# Patient Record
Sex: Female | Born: 1965 | Race: Black or African American | Hispanic: No | Marital: Single | Smoking: Current every day smoker
Health system: Southern US, Community
[De-identification: ages and names within clinical notes are randomized; demographics above are authoritative.]

## PROBLEM LIST (undated history)

## (undated) DIAGNOSIS — E785 Hyperlipidemia, unspecified: Secondary | ICD-10-CM

## (undated) DIAGNOSIS — I10 Essential (primary) hypertension: Secondary | ICD-10-CM

## (undated) DIAGNOSIS — E119 Type 2 diabetes mellitus without complications: Secondary | ICD-10-CM

## (undated) HISTORY — PX: ABDOMINAL HYSTERECTOMY: SHX81

## (undated) HISTORY — DX: Type 2 diabetes mellitus without complications: E11.9

## (undated) HISTORY — DX: Hyperlipidemia, unspecified: E78.5

## (undated) HISTORY — PX: BREAST EXCISIONAL BIOPSY: SUR124

## (undated) HISTORY — DX: Essential (primary) hypertension: I10

## (undated) HISTORY — PX: OTHER SURGICAL HISTORY: SHX169

---

## 1997-10-20 ENCOUNTER — Other Ambulatory Visit: Admission: RE | Admit: 1997-10-20 | Discharge: 1997-10-20 | Payer: Self-pay | Admitting: *Deleted

## 1997-11-25 ENCOUNTER — Encounter: Admission: RE | Admit: 1997-11-25 | Discharge: 1997-11-25 | Payer: Self-pay | Admitting: Obstetrics & Gynecology

## 1997-12-01 ENCOUNTER — Ambulatory Visit (HOSPITAL_COMMUNITY): Admission: RE | Admit: 1997-12-01 | Discharge: 1997-12-01 | Payer: Self-pay | Admitting: Internal Medicine

## 1997-12-23 ENCOUNTER — Encounter: Admission: RE | Admit: 1997-12-23 | Discharge: 1997-12-23 | Payer: Self-pay | Admitting: Obstetrics & Gynecology

## 1998-01-08 ENCOUNTER — Inpatient Hospital Stay (HOSPITAL_COMMUNITY): Admission: AD | Admit: 1998-01-08 | Discharge: 1998-01-08 | Payer: Self-pay | Admitting: Obstetrics

## 1998-05-04 ENCOUNTER — Inpatient Hospital Stay (HOSPITAL_COMMUNITY): Admission: RE | Admit: 1998-05-04 | Discharge: 1998-05-06 | Payer: Self-pay | Admitting: Obstetrics and Gynecology

## 1998-12-11 ENCOUNTER — Emergency Department (HOSPITAL_COMMUNITY): Admission: EM | Admit: 1998-12-11 | Discharge: 1998-12-11 | Payer: Self-pay | Admitting: Emergency Medicine

## 1998-12-11 ENCOUNTER — Encounter: Payer: Self-pay | Admitting: Emergency Medicine

## 1999-01-05 ENCOUNTER — Inpatient Hospital Stay (HOSPITAL_COMMUNITY): Admission: AD | Admit: 1999-01-05 | Discharge: 1999-01-05 | Payer: Self-pay | Admitting: *Deleted

## 2000-11-02 ENCOUNTER — Emergency Department (HOSPITAL_COMMUNITY): Admission: EM | Admit: 2000-11-02 | Discharge: 2000-11-02 | Payer: Self-pay | Admitting: Emergency Medicine

## 2000-12-25 ENCOUNTER — Other Ambulatory Visit: Admission: RE | Admit: 2000-12-25 | Discharge: 2000-12-25 | Payer: Self-pay | Admitting: Internal Medicine

## 2002-08-01 ENCOUNTER — Inpatient Hospital Stay (HOSPITAL_COMMUNITY): Admission: AD | Admit: 2002-08-01 | Discharge: 2002-08-01 | Payer: Self-pay | Admitting: Family Medicine

## 2003-01-17 ENCOUNTER — Ambulatory Visit (HOSPITAL_COMMUNITY): Admission: RE | Admit: 2003-01-17 | Discharge: 2003-01-17 | Payer: Self-pay | Admitting: Obstetrics

## 2003-01-17 ENCOUNTER — Encounter: Payer: Self-pay | Admitting: Obstetrics

## 2003-03-13 ENCOUNTER — Emergency Department (HOSPITAL_COMMUNITY): Admission: AD | Admit: 2003-03-13 | Discharge: 2003-03-13 | Payer: Self-pay | Admitting: Family Medicine

## 2003-03-20 ENCOUNTER — Inpatient Hospital Stay (HOSPITAL_COMMUNITY): Admission: RE | Admit: 2003-03-20 | Discharge: 2003-03-22 | Payer: Self-pay | Admitting: Obstetrics

## 2003-06-19 ENCOUNTER — Emergency Department (HOSPITAL_COMMUNITY): Admission: AD | Admit: 2003-06-19 | Discharge: 2003-06-19 | Payer: Self-pay | Admitting: Family Medicine

## 2003-06-28 ENCOUNTER — Emergency Department (HOSPITAL_COMMUNITY): Admission: AD | Admit: 2003-06-28 | Discharge: 2003-06-28 | Payer: Self-pay | Admitting: Family Medicine

## 2004-02-04 ENCOUNTER — Emergency Department (HOSPITAL_COMMUNITY): Admission: EM | Admit: 2004-02-04 | Discharge: 2004-02-04 | Payer: Self-pay | Admitting: Family Medicine

## 2004-05-28 ENCOUNTER — Ambulatory Visit (HOSPITAL_COMMUNITY): Admission: RE | Admit: 2004-05-28 | Discharge: 2004-05-28 | Payer: Self-pay | Admitting: Obstetrics

## 2004-10-15 ENCOUNTER — Emergency Department (HOSPITAL_COMMUNITY): Admission: EM | Admit: 2004-10-15 | Discharge: 2004-10-15 | Payer: Self-pay | Admitting: Family Medicine

## 2006-07-31 ENCOUNTER — Emergency Department (HOSPITAL_COMMUNITY): Admission: EM | Admit: 2006-07-31 | Discharge: 2006-07-31 | Payer: Self-pay | Admitting: Family Medicine

## 2010-10-24 ENCOUNTER — Inpatient Hospital Stay (INDEPENDENT_AMBULATORY_CARE_PROVIDER_SITE_OTHER)
Admission: RE | Admit: 2010-10-24 | Discharge: 2010-10-24 | Disposition: A | Discharge status: Home / Self Care | Payer: Self-pay | Source: Ambulatory Visit | Attending: Family Medicine | Admitting: Family Medicine

## 2010-10-24 DIAGNOSIS — L42 Pityriasis rosea: Secondary | ICD-10-CM

## 2010-12-07 ENCOUNTER — Inpatient Hospital Stay (INDEPENDENT_AMBULATORY_CARE_PROVIDER_SITE_OTHER)
Admission: RE | Admit: 2010-12-07 | Discharge: 2010-12-07 | Disposition: A | Discharge status: Home / Self Care | Payer: Self-pay | Source: Ambulatory Visit | Attending: Family Medicine | Admitting: Family Medicine

## 2010-12-07 DIAGNOSIS — S335XXA Sprain of ligaments of lumbar spine, initial encounter: Secondary | ICD-10-CM

## 2010-12-07 DIAGNOSIS — S239XXA Sprain of unspecified parts of thorax, initial encounter: Secondary | ICD-10-CM

## 2012-01-15 ENCOUNTER — Encounter (HOSPITAL_COMMUNITY): Payer: Self-pay | Admitting: Emergency Medicine

## 2012-01-15 ENCOUNTER — Emergency Department (INDEPENDENT_AMBULATORY_CARE_PROVIDER_SITE_OTHER)
Admission: EM | Admit: 2012-01-15 | Discharge: 2012-01-15 | Disposition: A | Discharge status: Home / Self Care | Payer: PRIVATE HEALTH INSURANCE | Source: Home / Self Care

## 2012-01-15 DIAGNOSIS — K529 Noninfective gastroenteritis and colitis, unspecified: Secondary | ICD-10-CM

## 2012-01-15 DIAGNOSIS — R1084 Generalized abdominal pain: Secondary | ICD-10-CM

## 2012-01-15 DIAGNOSIS — K5289 Other specified noninfective gastroenteritis and colitis: Secondary | ICD-10-CM

## 2012-01-15 LAB — POCT URINALYSIS DIP (DEVICE)
Bilirubin Urine: NEGATIVE
Glucose, UA: NEGATIVE mg/dL
Ketones, ur: NEGATIVE mg/dL
Leukocytes, UA: NEGATIVE
Nitrite: NEGATIVE
Protein, ur: NEGATIVE mg/dL
Specific Gravity, Urine: 1.015 (ref 1.005–1.030)
Urobilinogen, UA: 0.2 mg/dL (ref 0.0–1.0)
pH: 5.5 (ref 5.0–8.0)

## 2012-01-15 MED ORDER — HYOSCYAMINE SULFATE 0.125 MG SL SUBL: 0.1250 mg | SUBLINGUAL_TABLET | SUBLINGUAL | Status: DC | PRN

## 2012-01-15 MED ORDER — ONDANSETRON HCL 4 MG PO TABS: 4.0000 mg | ORAL_TABLET | Freq: Four times a day (QID) | ORAL | Status: DC

## 2012-01-15 NOTE — ED Provider Notes (Signed)
History     CSN: [id number]  Arrival date & time 01/15/12  1520   None     Chief Complaint  Patient presents with  . Abdominal Pain    (Consider location/radiation/quality/duration/timing/severity/associated sxs/prior treatment) HPI Comments: Refill female presents with abdominal cramping. 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 This week this patient developed the same symptoms which lasted for about 3 days. Today she has no or vomiting or diarrhea but continues to have nausea and severe abdominal cramping. She is able to hold food and liquids down. Just complains of urinary frequency for 2 days but no dysuria   History reviewed. No pertinent past medical history.  Past Surgical History  Procedure Date  . Partial historectomy     History reviewed. No pertinent family history.  History  Substance Use Topics  . Smoking status: Current Every Day Smoker -- 1.0 packs/day    Types: Cigarettes  . Smokeless tobacco: Not on file  . Alcohol Use: Yes    OB History    Grav Para Term Preterm Abortions TAB SAB Ect Mult Living                  Review of Systems  Constitutional: Negative for fever, activity change and fatigue.  HENT: Negative.   Respiratory: Negative for cough, shortness of breath and wheezing.   Cardiovascular: Negative for chest pain and palpitations.  Gastrointestinal: Positive for nausea and abdominal pain. Negative for vomiting, diarrhea, constipation and blood in stool.  Genitourinary: Positive for frequency. Negative for dysuria.  Musculoskeletal: Negative.   Skin: Negative for color change, pallor and rash.  Neurological: Negative.     Allergies  Review of patient's allergies indicates no known allergies.  Home Medications   Current Outpatient Rx  Name Route Sig Dispense Refill  . HYOSCYAMINE SULFATE 0.125 MG SL SUBL  Sublingual Place 1 tablet (0.125 mg total) under the tongue every 4 (four) hours as needed for cramping. 30 tablet 0  . ONDANSETRON HCL 4 MG PO TABS Oral Take 1 tablet (4 mg total) by mouth every 6 (six) hours. 12 tablet 0    BP 144/84  Pulse 87  Temp 99.1 F (37.3 C) (Oral)  Resp 14  SpO2 100%  Physical Exam  Constitutional: She is oriented to person, place, and time. She appears well-developed and well-nourished. No distress.  HENT:  Head: Normocephalic and atraumatic.  Mouth/Throat: Oropharynx is clear and moist. No oropharyngeal exudate.  Eyes: EOM are normal. Pupils are equal, round, and reactive to light.  Neck: Normal range of motion. Neck supple.  Cardiovascular: Normal rate and normal heart sounds.   Pulmonary/Chest: Effort normal and breath sounds normal. No respiratory distress.  Abdominal: Soft. She exhibits no mass. There is tenderness. There is no rebound and no guarding.       Abdominal tenderness in the epigastrium  Musculoskeletal: Normal range of motion.  Neurological: She is alert and oriented to person, place, and time. No cranial nerve deficit.  Skin: Skin is warm and dry.  Psychiatric: She has a normal mood and affect.    ED Course  Procedures (including critical care time)  Labs Reviewed - No data to display No results found.   1. Gastroenteritis   2. Generalized abdominal cramps       MDM  Clear liquids and Gatorade in small amounts. Frequently Less than one by mouth  every 4-6 hours when necessary abdominal cramping Zofran 4 mg 1 by mouth every 4-6 hours when necessary nausea They also take Zantac 75 twice a day when necessary BRAT diet Results for orders placed during the hospital encounter of 01/15/12  POCT URINALYSIS DIP (DEVICE)      Component Value Range   Glucose, UA NEGATIVE  NEGATIVE mg/dL   Bilirubin Urine NEGATIVE  NEGATIVE   Ketones, ur NEGATIVE  NEGATIVE mg/dL   Specific Gravity, Urine 1.015  1.005 - 1.030   Hgb urine dipstick  TRACE (*) NEGATIVE   pH 5.5  5.0 - 8.0   Protein, ur NEGATIVE  NEGATIVE mg/dL   Urobilinogen, UA 0.2  0.0 - 1.0 mg/dL   Nitrite NEGATIVE  NEGATIVE   Leukocytes, UA NEGATIVE  NEGATIVE           Hayden Rasmussen, NP 01/15/12 1744  Hayden Rasmussen, NP 01/15/12 1747

## 2012-01-15 NOTE — ED Notes (Signed)
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 At the beginning of this past week pt had diarrhea and vomiting x 2/3 days.  No longer has vomiting and diarrhea since Wednesday but still having severe stomach cramps. Denies any other symptoms.

## 2012-01-15 NOTE — ED Provider Notes (Signed)
Medical screening examination/treatment/procedure(s) were performed by resident physician or non-physician practitioner and as supervising physician I was immediately available for consultation/collaboration.   Barkley Bruns MD.    Linna Hoff, MD 01/15/12 [phone]

## 2013-03-03 ENCOUNTER — Encounter (HOSPITAL_COMMUNITY): Payer: Self-pay | Admitting: Emergency Medicine

## 2013-03-03 ENCOUNTER — Emergency Department (HOSPITAL_COMMUNITY)
Admission: EM | Admit: 2013-03-03 | Discharge: 2013-03-03 | Disposition: A | Discharge status: Home / Self Care | Payer: Self-pay | Source: Home / Self Care

## 2013-03-03 DIAGNOSIS — H109 Unspecified conjunctivitis: Secondary | ICD-10-CM

## 2013-03-03 MED ORDER — KETOTIFEN FUMARATE 0.025 % OP SOLN: 1.0000 [drp] | Freq: Two times a day (BID) | OPHTHALMIC | Status: DC

## 2013-03-03 MED ORDER — POLYMYXIN B-TRIMETHOPRIM 10000-0.1 UNIT/ML-% OP SOLN: 1.0000 [drp] | OPHTHALMIC | Status: DC

## 2013-03-03 NOTE — ED Provider Notes (Signed)
CSN: [id number]     Arrival date & time 03/03/13  0920 History   First MD Initiated Contact with Patient 03/03/13 [phone]     Chief Complaint  Patient presents with  . Conjunctivitis   (Consider location/radiation/quality/duration/timing/severity/associated sxs/prior Treatment) HPI Comments: C/O OD itching, upper and lower lid swelling, tearing and general discomfort of R eye since yesterday; worsening over the night. No problems with vision   History reviewed. No pertinent past medical history. Past Surgical History  Procedure Laterality Date  . Partial historectomy     No family history on file. History  Substance Use Topics  . Smoking status: Current Every Day Smoker -- 1.00 packs/day    Types: Cigarettes  . Smokeless tobacco: Not on file  . Alcohol Use: Yes   OB History   Grav Para Term Preterm Abortions TAB SAB Ect Mult Living                 Review of Systems  Constitutional: Negative.   HENT: Negative.   Eyes: Positive for discharge, redness and itching. Negative for photophobia and visual disturbance.  Respiratory: Negative.   All other systems reviewed and are negative.    Allergies  Review of patient's allergies indicates no known allergies.  Home Medications   Current Outpatient Rx  Name  Route  Sig  Dispense  Refill  . lisinopril-hydrochlorothiazide (PRINZIDE,ZESTORETIC) 20-12.5 MG per tablet   Oral   Take 1 tablet by mouth daily.         . rosuvastatin (CRESTOR) 20 MG tablet   Oral   Take 20 mg by mouth daily.         . Vitamin D, Ergocalciferol, (DRISDOL) 50000 UNITS CAPS capsule   Oral   Take 50,000 Units by mouth every 7 (seven) days.         . hyoscyamine (LEVSIN/SL) 0.125 MG SL tablet   Sublingual   Place 1 tablet (0.125 mg total) under the tongue every 4 (four) hours as needed for cramping.   30 tablet   0   . ondansetron (ZOFRAN) 4 MG tablet   Oral   Take 1 tablet (4 mg total) by mouth every 6 (six) hours.   12 tablet   0     BP 148/79  Pulse 83  Temp(Src) 98.4 F (36.9 C) (Oral)  Resp 20  SpO2 100% Physical Exam  Nursing note and vitals reviewed. Constitutional: She is oriented to person, place, and time. She appears well-developed and well-nourished. No distress.  Eyes:  Mild upper and lower lid edema, conjunctival erythema , mucoid clear discharge.  The OS with early signs of conjunctivitis but asymptomatic.  + for bilateral xanthelasma and corneal arcus.  Neck: Normal range of motion. Neck supple.  Cardiovascular: Normal rate.   Pulmonary/Chest: Effort normal. No respiratory distress.  Lymphadenopathy:    She has no cervical adenopathy.  Neurological: She is alert and oriented to person, place, and time. She exhibits normal muscle tone.  Skin: Skin is warm and dry.  Psychiatric: She has a normal mood and affect.    ED Course  Procedures (including critical care time) Labs Review Labs Reviewed - No data to display Imaging Review No results found.     MDM   1. Conjunctivitis of right eye      Polytrim OP as directed zaditor bid Warm compresses. Wash hands frequently  Hayden Rasmussen, NP 03/03/13 1009

## 2013-03-03 NOTE — ED Notes (Signed)
47 yr old is here today with complaints of red itching right x last night. She states she woke up with her right eye red but ir started itching last night. No other complaints.

## 2013-03-06 NOTE — ED Provider Notes (Signed)
Medical screening examination/treatment/procedure(s) were performed by resident physician or non-physician practitioner and as supervising physician I was immediately available for consultation/collaboration.   Barkley Bruns MD.   Linna Hoff, MD 03/06/13 Ebony Cargo

## 2013-03-07 ENCOUNTER — Other Ambulatory Visit: Payer: Self-pay

## 2013-03-07 DIAGNOSIS — Z1231 Encounter for screening mammogram for malignant neoplasm of breast: Secondary | ICD-10-CM

## 2013-04-22 ENCOUNTER — Ambulatory Visit: Payer: PRIVATE HEALTH INSURANCE

## 2013-05-07 ENCOUNTER — Emergency Department (INDEPENDENT_AMBULATORY_CARE_PROVIDER_SITE_OTHER): Payer: No Typology Code available for payment source

## 2013-05-07 ENCOUNTER — Encounter (HOSPITAL_COMMUNITY): Payer: Self-pay | Admitting: Emergency Medicine

## 2013-05-07 ENCOUNTER — Emergency Department (INDEPENDENT_AMBULATORY_CARE_PROVIDER_SITE_OTHER)
Admission: EM | Admit: 2013-05-07 | Discharge: 2013-05-07 | Disposition: A | Discharge status: Home / Self Care | Payer: No Typology Code available for payment source | Source: Home / Self Care | Attending: Family Medicine | Admitting: Family Medicine

## 2013-05-07 DIAGNOSIS — W1789XA Other fall from one level to another, initial encounter: Secondary | ICD-10-CM

## 2013-05-07 DIAGNOSIS — S93409A Sprain of unspecified ligament of unspecified ankle, initial encounter: Secondary | ICD-10-CM

## 2013-05-07 DIAGNOSIS — S93402A Sprain of unspecified ligament of left ankle, initial encounter: Secondary | ICD-10-CM

## 2013-05-07 MED ORDER — HYDROCODONE-ACETAMINOPHEN 5-325 MG PO TABS: 1.0000 | ORAL_TABLET | Freq: Four times a day (QID) | ORAL | Status: DC | PRN

## 2013-05-07 NOTE — ED Provider Notes (Signed)
CSN: [id number]     Arrival date & time 05/07/13  1314 History   First MD Initiated Contact with Patient 05/07/13 1433     Chief Complaint  Patient presents with  . Ankle Injury   (Consider location/radiation/quality/duration/timing/severity/associated sxs/prior Treatment) Patient is a 48 y.o. female presenting with lower extremity injury. The history is provided by the patient and the spouse.  Ankle Injury This is a new problem. The current episode started 6 to 12 hours ago (stepped in pothole and fell striking left knee and twisting ankle.). The problem has not changed since onset.The symptoms are aggravated by walking.    History reviewed. No pertinent past medical history. Past Surgical History  Procedure Laterality Date  . Partial historectomy     History reviewed. No pertinent family history. History  Substance Use Topics  . Smoking status: Current Every Day Smoker -- 1.00 packs/day    Types: Cigarettes  . Smokeless tobacco: Not on file  . Alcohol Use: Yes   OB History   Grav Para Term Preterm Abortions TAB SAB Ect Mult Living                 Review of Systems  Constitutional: Negative.   Musculoskeletal: Positive for gait problem and joint swelling.  Skin: Negative.     Allergies  Review of patient's allergies indicates no known allergies.  Home Medications   Current Outpatient Rx  Name  Route  Sig  Dispense  Refill  . HYDROcodone-acetaminophen (NORCO/VICODIN) 5-325 MG per tablet   Oral   Take 1 tablet by mouth every 6 (six) hours as needed. For pain   15 tablet   0   . hyoscyamine (LEVSIN/SL) 0.125 MG SL tablet   Sublingual   Place 1 tablet (0.125 mg total) under the tongue every 4 (four) hours as needed for cramping.   30 tablet   0   . ketotifen (ZADITOR) 0.025 % ophthalmic solution   Both Eyes   Place 1 drop into both eyes 2 (two) times daily.   5 mL   0   . lisinopril-hydrochlorothiazide (PRINZIDE,ZESTORETIC) 20-12.5 MG per tablet   Oral  Take 1 tablet by mouth daily.         . ondansetron (ZOFRAN) 4 MG tablet   Oral   Take 1 tablet (4 mg total) by mouth every 6 (six) hours.   12 tablet   0   . rosuvastatin (CRESTOR) 20 MG tablet   Oral   Take 20 mg by mouth daily.         Marland Kitchen trimethoprim-polymyxin b (POLYTRIM) ophthalmic solution   Both Eyes   Place 1 drop into both eyes every 4 (four) hours.   10 mL   0   . Vitamin D, Ergocalciferol, (DRISDOL) 50000 UNITS CAPS capsule   Oral   Take 50,000 Units by mouth every 7 (seven) days.          BP 190/112  Pulse 92  Temp(Src) 99 F (37.2 C) (Oral)  Resp 18  SpO2 100% Physical Exam  Nursing note and vitals reviewed. Constitutional: She is oriented to person, place, and time. She appears well-developed and well-nourished.  Musculoskeletal: She exhibits tenderness.       Left ankle: She exhibits decreased range of motion and swelling. She exhibits no deformity and normal pulse. Tenderness. Lateral malleolus, AITFL and head of 5th metatarsal tenderness found. No medial malleolus and no proximal fibula tenderness found. Achilles tendon normal.       Legs:  Neurological: She is alert and oriented to person, place, and time.  Skin: Skin is warm and dry.    ED Course  Procedures (including critical care time) Labs Review Labs Reviewed - No data to display Imaging Review Dg Ankle Complete Left  05/07/2013   CLINICAL DATA:  Patient stepped in a hole and fell twisting left ankle pain laterally  EXAM: LEFT ANKLE COMPLETE - 3+ VIEW  COMPARISON:  None.  FINDINGS: Mild lateral soft tissue swelling. Mortise is intact. No fracture or dislocation. Small ossicle noted off the medial malleolus, an anatomic variant. Small heel spur.  IMPRESSION: Mild sprain   Electronically Signed   By: Esperanza Heiraymond  Rubner M.D.   On: 05/07/2013 15:02      MDM  X-rays reviewed and report per radiologist.     Linna HoffJames D Chelsa Stout, MD 05/07/13 [phone]

## 2013-05-07 NOTE — Discharge Instructions (Signed)
Wear ankle support as needed for comfort,  Use crutches or activity as tolerated. advil or pain medicine as needed, ice  and elevate for pain and swelling ,return or see orthopedist if further problems.

## 2013-05-07 NOTE — ED Notes (Signed)
C/o left ankle injury which happened today while going to work States she hit a [employer] Data Systemspothole  States as she fell down she hit a left knee and twisted ankle

## 2013-05-16 ENCOUNTER — Ambulatory Visit
Admission: RE | Admit: 2013-05-16 | Discharge: 2013-05-16 | Disposition: A | Discharge status: Home / Self Care | Payer: No Typology Code available for payment source | Source: Ambulatory Visit

## 2013-05-16 DIAGNOSIS — Z1231 Encounter for screening mammogram for malignant neoplasm of breast: Secondary | ICD-10-CM

## 2013-05-20 ENCOUNTER — Other Ambulatory Visit: Payer: Self-pay | Admitting: Nurse Practitioner

## 2013-05-20 DIAGNOSIS — R928 Other abnormal and inconclusive findings on diagnostic imaging of breast: Secondary | ICD-10-CM

## 2013-06-03 ENCOUNTER — Other Ambulatory Visit: Payer: No Typology Code available for payment source

## 2013-06-19 ENCOUNTER — Ambulatory Visit
Admission: RE | Admit: 2013-06-19 | Discharge: 2013-06-19 | Disposition: A | Discharge status: Home / Self Care | Payer: No Typology Code available for payment source | Source: Ambulatory Visit | Attending: Nurse Practitioner | Admitting: Nurse Practitioner

## 2013-06-19 ENCOUNTER — Other Ambulatory Visit: Payer: Self-pay | Admitting: Nurse Practitioner

## 2013-06-19 DIAGNOSIS — R928 Other abnormal and inconclusive findings on diagnostic imaging of breast: Secondary | ICD-10-CM

## 2013-06-26 ENCOUNTER — Ambulatory Visit
Admission: RE | Admit: 2013-06-26 | Discharge: 2013-06-26 | Disposition: A | Discharge status: Home / Self Care | Payer: No Typology Code available for payment source | Source: Ambulatory Visit | Attending: Nurse Practitioner | Admitting: Nurse Practitioner

## 2013-06-26 ENCOUNTER — Other Ambulatory Visit: Payer: Self-pay | Admitting: Nurse Practitioner

## 2013-06-26 DIAGNOSIS — R928 Other abnormal and inconclusive findings on diagnostic imaging of breast: Secondary | ICD-10-CM

## 2013-10-03 ENCOUNTER — Encounter: Payer: BC Managed Care – PPO | Attending: Internal Medicine

## 2013-10-03 VITALS — Ht 65.0 in | Wt 198.0 lb

## 2013-10-03 DIAGNOSIS — Z713 Dietary counseling and surveillance: Secondary | ICD-10-CM | POA: Diagnosis not present

## 2013-10-03 DIAGNOSIS — E119 Type 2 diabetes mellitus without complications: Secondary | ICD-10-CM | POA: Diagnosis not present

## 2013-10-04 NOTE — Progress Notes (Signed)
Patient was seen on 10/03/13 for the first of a series of three diabetes self-management courses at the [facility].  Current HbA1c: unknown  The following learning objectives were met by the patient during this class:  Describe diabetes  State some common risk factors for diabetes  Defines the role of glucose and insulin  Identifies type of diabetes and pathophysiology  Describe the relationship between diabetes and cardiovascular risk  State the members of the Healthcare Team  States the rationale for glucose monitoring  State when to test glucose  State their individual Target Range  State the importance of logging glucose readings  Describe how to interpret glucose readings  Identifies A1C target  Explain the correlation between A1c and eAG values  State symptoms and treatment of high blood glucose  State symptoms and treatment of low blood glucose  Explain proper technique for glucose testing  Identifies proper sharps disposal  Handouts given during class include:  Living Well with Diabetes book  Carb Counting and Meal Planning book  Meal Plan Card  Carbohydrate guide  Meal planning worksheet  Low Sodium Flavoring Tips  The diabetes portion plate  K5T to eAG Conversion Chart  Diabetes Medications  Diabetes Recommended Care Schedule  Support Group  Diabetes Success Plan  Core Class Satisfaction Survey  Follow-Up Plan:  Attend core 2

## 2013-10-10 DIAGNOSIS — E119 Type 2 diabetes mellitus without complications: Secondary | ICD-10-CM | POA: Diagnosis not present

## 2013-10-10 NOTE — Progress Notes (Signed)

## 2013-10-17 DIAGNOSIS — E119 Type 2 diabetes mellitus without complications: Secondary | ICD-10-CM

## 2013-10-17 NOTE — Progress Notes (Signed)
Patient was seen on 10/17/13 for the third of a series of three diabetes self-management courses at the [facility]. The following learning objectives were met by the patient during this class:    State the amount of activity recommended for healthy living   Describe activities suitable for individual needs   Identify ways to regularly incorporate activity into daily life   Identify barriers to activity and ways to over come these barriers  Identify diabetes medications being personally used and their primary action for lowering glucose and possible side effects   Describe role of stress on blood glucose and develop strategies to address psychosocial issues   Identify diabetes complications and ways to prevent them  Explain how to manage diabetes during illness   Evaluate success in meeting personal goal   Establish 2-3 goals that they will plan to diligently work on until they return for the  52-monthfollow-up visit  Goals:  Follow Diabetes Meal Plan as instructed  Aim for 15-30 mins of physical activity daily as tolerated  Bring food record and glucose log to your follow up visit  Your patient has established the following 4 month goals in their individualized success plan: I will test my glucose  I will manage stress   Your patient has identified these potential barriers to change:  None  Your patient has identified their diabetes self-care support plan as  [facility]  Family Support  Plan:  Attend Core 4 in 4 months

## 2014-02-03 ENCOUNTER — Encounter: Payer: BC Managed Care – PPO | Attending: Internal Medicine

## 2014-02-03 DIAGNOSIS — E119 Type 2 diabetes mellitus without complications: Secondary | ICD-10-CM | POA: Insufficient documentation

## 2014-02-03 DIAGNOSIS — Z713 Dietary counseling and surveillance: Secondary | ICD-10-CM | POA: Diagnosis not present

## 2014-02-03 NOTE — Progress Notes (Signed)
Appt start time: 1730 end time:  1830.  Patient was seen on 02/03/14 for a review of the series of three diabetes self-management courses at the [facility]. The following learning objectives were met by the patient during this class:  . Reviewed blood glucose monitoring and interpretation including the recommended target ranges and Hgb A1c.  . Reviewed on carb counting, importance of regularly scheduled meals/snacks, and meal planning.  . Reviewed the effects of physical activity on glucose levels and long-term glucose control.  Recommended goal of 150 minutes of physical activity/week. . Reviewed patient medications and discussed role of medication on blood glucose and possible side effects. . Discussed strategies to manage stress, psychosocial issues, and other obstacles to diabetes management. . Encouraged moderate weight reduction to improve glucose levels.   . Reviewed short-term complications: hyper- and hypo-glycemia.  Discussed causes, symptoms, and treatment options. . Reviewed prevention, detection, and treatment of long-term complications.  Discussed the role of prolonged elevated glucose levels on body systems.  Goals:  Follow Diabetes Meal Plan as instructed  Eat 3 meals and 2 snacks, every 3-5 hrs  Limit carbohydrate intake to 45 grams carbohydrate/meal Limit carbohydrate intake to 15 grams carbohydrate/snack Add lean protein foods to meals/snacks  Monitor glucose levels as instructed by your doctor  Aim for goal of 15-30 mins of physical activity daily as tolerated  Bring food record and glucose log to your next nutrition visit   

## 2018-11-03 ENCOUNTER — Other Ambulatory Visit: Payer: Self-pay | Admitting: Internal Medicine

## 2018-11-03 DIAGNOSIS — Z20822 Contact with and (suspected) exposure to covid-19: Secondary | ICD-10-CM

## 2018-11-04 LAB — NOVEL CORONAVIRUS, NAA: SARS-CoV-2, NAA: NOT DETECTED

## 2019-02-22 ENCOUNTER — Other Ambulatory Visit: Payer: Self-pay

## 2019-02-22 DIAGNOSIS — Z20822 Contact with and (suspected) exposure to covid-19: Secondary | ICD-10-CM

## 2019-02-25 LAB — NOVEL CORONAVIRUS, NAA: SARS-CoV-2, NAA: NOT DETECTED

## 2019-05-29 ENCOUNTER — Telehealth: Payer: Self-pay

## 2019-05-29 NOTE — Telephone Encounter (Signed)
PT LVM WANTING TO ESTABLISH CARE, RTN CALL TO PT MOTHER ANSWERED PHONE STATED THAT SHE WILL ADV [facility] TO CALL BACK

## 2019-06-01 ENCOUNTER — Ambulatory Visit: Payer: 59 | Attending: Internal Medicine

## 2019-06-01 DIAGNOSIS — Z23 Encounter for immunization: Secondary | ICD-10-CM | POA: Insufficient documentation

## 2019-06-01 NOTE — Progress Notes (Signed)
   [facility]ov[facility] Vaccination [facility]  Name:  Madeline Blankenship    MRN: [id number] DOB: May 07, 1965  06/01/2019  [name] was observed post Covid-19 immunization for 15 minutes without incidence. She was provided with Vaccine Information Sheet and instruction to access the V-Safe system.   [name] was instructed to call 911 with any severe reactions post vaccine: Marland Kitchen Difficulty breathing  . Swelling of your face and throat  . A fast heartbeat  . A bad rash all over your body  . Dizziness and weakness    Immunizations Administered    Name Date Dose VIS Date Route   Pfizer COVID-19 Vaccine 06/01/2019 10:00 AM 0.3 mL 03/15/2019 Intramuscular   Manufacturer: [employer], [employer]   Lot: [id number]   NDC: [address]-2

## 2019-06-19 ENCOUNTER — Encounter: Payer: Self-pay | Admitting: Nurse Practitioner

## 2019-06-19 ENCOUNTER — Ambulatory Visit: Payer: 59 | Admitting: Nurse Practitioner

## 2019-06-19 ENCOUNTER — Other Ambulatory Visit: Payer: Self-pay

## 2019-06-19 VITALS — BP 144/86 | HR 99 | Temp 98.7°F | Ht 65.17 in | Wt 194.4 lb

## 2019-06-19 DIAGNOSIS — E782 Mixed hyperlipidemia: Secondary | ICD-10-CM

## 2019-06-19 DIAGNOSIS — E119 Type 2 diabetes mellitus without complications: Secondary | ICD-10-CM | POA: Diagnosis not present

## 2019-06-19 DIAGNOSIS — M25562 Pain in left knee: Secondary | ICD-10-CM

## 2019-06-19 DIAGNOSIS — I10 Essential (primary) hypertension: Secondary | ICD-10-CM | POA: Diagnosis not present

## 2019-06-19 DIAGNOSIS — E559 Vitamin D deficiency, unspecified: Secondary | ICD-10-CM | POA: Diagnosis not present

## 2019-06-19 DIAGNOSIS — M25541 Pain in joints of right hand: Secondary | ICD-10-CM

## 2019-06-19 DIAGNOSIS — G8929 Other chronic pain: Secondary | ICD-10-CM

## 2019-06-19 DIAGNOSIS — Z1211 Encounter for screening for malignant neoplasm of colon: Secondary | ICD-10-CM

## 2019-06-19 DIAGNOSIS — M25542 Pain in joints of left hand: Secondary | ICD-10-CM

## 2019-06-19 DIAGNOSIS — Z1231 Encounter for screening mammogram for malignant neoplasm of breast: Secondary | ICD-10-CM

## 2019-06-19 MED ORDER — AMLODIPINE BESYLATE 5 MG PO TABS: 5.0000 mg | ORAL_TABLET | Freq: Every day | ORAL | 2 refills | Status: DC

## 2019-06-19 NOTE — Progress Notes (Signed)
This visit occurred during the SARS-CoV-2 public health emergency.  Safety protocols were in place, including screening questions prior to the visit, additional usage of staff PPE, and extensive cleaning of exam room while observing appropriate contact time as indicated for disinfecting solutions.  Subjective:     Patient ID: Madeline Blankenship , female    DOB: March 02, 1966 , 54 y.o.   MRN: [id number]   Chief Complaint  Patient presents with  . Establish Care    HPI  Here to establish care - had been here previously with Dr. Karlton Blankenship left due to no insurance.  No PCP since that time.  She is currently working in childcare. She has 3 children who are grown.    Since not seeing a PCP she had been having sinus problems.  She fell last year injured her knee with have some pain from time to time with quick moves.    PMH - Hypertension, DM, hyperlipidemia, and low vitamin d.  She has not seen an eye doctor as well.   Joint pain to hands - she is unable to bend her 3rd phalange of left hand.  She continues to use the cream as well to help.  She eats fish.  Does not drink beer.  Does not hurt worse at any point and time.   [facility] - Father - testicular cancer, mother - ovarian cancer and sister with ovarian cancer.   She has had a hysterectomy not sure if had complete.  She is a cigarette smoker - approximately 30 years - average 1/2 PPD.  Social drinker of alcohol.   She has had her 1st covid vaccine - nauseated for 3 hours the next day and soreness to arm.   Hypertension This is a chronic problem. The current episode started more than 1 year ago. The problem is uncontrolled. Pertinent negatives include no anxiety, chest pain, headaches, palpitations or shortness of breath.  Knee Pain  The incident occurred more than 1 week ago. The injury mechanism was a fall. Pain location: medial left knee. The quality of the pain is described as aching. The pain is at a severity of 8/10. The pain has been  intermittent since onset. Pertinent negatives include no inability to bear weight, loss of motion, loss of sensation, muscle weakness, numbness or tingling. Exacerbated by: stand up and walk, changing positions. Treatments tried: eve cream and icy hot. The treatment provided moderate (works for a couple days) relief.     Past Medical History:  Diagnosis Date  . Diabetes mellitus without complication ([place])   . Hyperlipidemia   . Hypertension      Family History  Problem Relation Age of Onset  . Diabetes Mother   . Hypertension Mother   . Cancer Mother   . Diabetes Father   . Hypertension Father   . Cancer Father   . Stroke Father   . Vision loss Paternal Aunt     No current outpatient medications on file.   Allergies  Allergen Reactions  . Codeine Itching     Review of Systems  Constitutional: Negative.  Negative for fatigue.  Respiratory: Negative.  Negative for shortness of breath.   Cardiovascular: Negative for chest pain, palpitations and leg swelling.  Neurological: Negative for dizziness, tingling, numbness and headaches.  Psychiatric/Behavioral: Negative.      Today's Vitals   06/19/19 1518  BP: (!) 144/86  Pulse: 99  Temp: 98.7 F (37.1 C)  TempSrc: Oral  SpO2: 99%  Weight: 194 lb 6.4  oz (88.2 kg)  Height: 5' 5.17" (1.655 m)   Body mass index is 32.19 kg/m.   Objective:  Physical Exam Constitutional:      General: She is not in acute distress.    Appearance: She is obese.  Cardiovascular:     Rate and Rhythm: Normal rate and regular rhythm.     Pulses: Normal pulses.     Heart sounds: Normal heart sounds. No murmur.  Pulmonary:     Effort: Pulmonary effort is normal. No respiratory distress.     Breath sounds: Normal breath sounds.  Musculoskeletal:        General: No swelling, tenderness or deformity.  Skin:    General: Skin is warm and dry.     Capillary Refill: Capillary refill takes less than 2 seconds.  Neurological:     General: No  focal deficit present.     Mental Status: She is alert and oriented to person, place, and time.     Cranial Nerves: No cranial nerve deficit.  Psychiatric:        Mood and Affect: Mood normal.        Behavior: Behavior normal.        Thought Content: Thought content normal.        Judgment: Judgment normal.         Assessment And Plan:     1. Type 2 diabetes mellitus without complication, without long-term current use of insulin ([place])  This is her first time here at the office  Will check HgbA1c  Will refer for her diabetic eye exam - CBC - Hemoglobin A1c - TSH - CMP14+EGFR - Ambulatory referral to Ophthalmology  2. Essential hypertension . B/P is fairly controlled.   . The importance of regular exercise and dietary modification was stressed to the patient.  . Stressed importance of losing ten percent of her body weight to help with B/P control.  - amLODipine (NORVASC) 5 MG tablet; Take 1 tablet (5 mg total) by mouth daily.  Dispense: 30 tablet; Refill: 2  3. Vitamin D deficiency  Will check vitamin D level and supplement as needed.     Also encouraged to spend 15 minutes in the sun daily.  - VITAMIN D 25 Hydroxy (Vit-D Deficiency, Fractures)  4. Mixed hyperlipidemia  Chronic, no current medications - Lipid panel  5. Arthralgia of both hands  Will check for inflammation, this may be related to arthritis - Sed Rate (ESR)  6. Chronic pain of left knee  Negative drawer test and negative ballotment  Will check knee xray - DG Knee Complete 4 Views Left; Future  7. Screening mammogram, encounter for  Pt instructed on Self Breast Exam.According to ACOG guidelines Women aged 11 and older are recommended to get an annual mammogram. Form completed and given to patient contact the [facility] for appointment scheduing.   Pt encouraged to get annual mammogram  8. Encounter for screening colonoscopy  According to USPTF Colorectal cancer Screening guidelines.  Colonoscopy is recommended every 10 years, starting at age 39years.  Will refer to GI for colon cancer screening. - Ambulatory referral to Gastroenterology   [name], FNP    THE PATIENT IS ENCOURAGED TO PRACTICE SOCIAL DISTANCING DUE TO THE COVID-19 PANDEMIC.

## 2019-06-20 ENCOUNTER — Other Ambulatory Visit: Payer: Self-pay

## 2019-06-20 DIAGNOSIS — E559 Vitamin D deficiency, unspecified: Secondary | ICD-10-CM

## 2019-06-20 DIAGNOSIS — E119 Type 2 diabetes mellitus without complications: Secondary | ICD-10-CM

## 2019-06-20 LAB — LIPID PANEL
Chol/HDL Ratio: 6.2 ratio — ABNORMAL HIGH (ref 0.0–4.4)
Cholesterol, Total: 228 mg/dL — ABNORMAL HIGH (ref 100–199)
HDL: 37 mg/dL — ABNORMAL LOW (ref >39)
LDL Chol Calc (NIH): 158 mg/dL — ABNORMAL HIGH (ref 0–99)
Triglycerides: 177 mg/dL — ABNORMAL HIGH (ref 0–149)
VLDL Cholesterol Cal: 33 mg/dL (ref 5–40)

## 2019-06-20 LAB — CBC
Hematocrit: 42.2 % (ref 34.0–46.6)
Hemoglobin: 14 g/dL (ref 11.1–15.9)
MCH: 29.8 pg (ref 26.6–33.0)
MCHC: 33.2 g/dL (ref 31.5–35.7)
MCV: 90 fL (ref 79–97)
Platelets: 348 10*3/uL (ref 150–450)
RBC: 4.7 x10E6/uL (ref 3.77–5.28)
RDW: 12.4 % (ref 11.7–15.4)
WBC: 9.9 10*3/uL (ref 3.4–10.8)

## 2019-06-20 LAB — SEDIMENTATION RATE: Sed Rate: 45 mm/hr — ABNORMAL HIGH (ref 0–40)

## 2019-06-20 LAB — CMP14+EGFR
ALT: 10 IU/L (ref 0–32)
AST: 10 IU/L (ref 0–40)
Albumin/Globulin Ratio: 1.6 (ref 1.2–2.2)
Albumin: 4.2 g/dL (ref 3.8–4.9)
Alkaline Phosphatase: 110 IU/L (ref 39–117)
BUN/Creatinine Ratio: 13 (ref 9–23)
BUN: 12 mg/dL (ref 6–24)
Bilirubin Total: <0.2 mg/dL (ref 0.0–1.2)
CO2: 23 mmol/L (ref 20–29)
Calcium: 9.5 mg/dL (ref 8.7–10.2)
Chloride: 105 mmol/L (ref 96–106)
Creatinine, Ser: 0.92 mg/dL (ref 0.57–1.00)
GFR calc Af Amer: 83 mL/min/{1.73_m2} (ref >59)
GFR calc non Af Amer: 72 mL/min/{1.73_m2} (ref >59)
Globulin, Total: 2.7 g/dL (ref 1.5–4.5)
Glucose: 98 mg/dL (ref 65–99)
Potassium: 4.2 mmol/L (ref 3.5–5.2)
Sodium: 141 mmol/L (ref 134–144)
Total Protein: 6.9 g/dL (ref 6.0–8.5)

## 2019-06-20 LAB — HEMOGLOBIN A1C
Est. average glucose Bld gHb Est-mCnc: 137 mg/dL
Hgb A1c MFr Bld: 6.4 % — ABNORMAL HIGH (ref 4.8–5.6)

## 2019-06-20 LAB — TSH: TSH: 1.78 u[IU]/mL (ref 0.450–4.500)

## 2019-06-20 LAB — VITAMIN D 25 HYDROXY (VIT D DEFICIENCY, FRACTURES): Vit D, 25-Hydroxy: 14.9 ng/mL — ABNORMAL LOW (ref 30.0–100.0)

## 2019-06-20 MED ORDER — VITAMIN D (ERGOCALCIFEROL) 1.25 MG (50000 UNIT) PO CAPS: 50000.0000 [IU] | ORAL_CAPSULE | ORAL | 0 refills | Status: DC

## 2019-06-20 MED ORDER — METFORMIN HCL 500 MG PO TABS: 500.0000 mg | ORAL_TABLET | Freq: Two times a day (BID) | ORAL | 0 refills | Status: DC

## 2019-06-22 ENCOUNTER — Ambulatory Visit: Payer: 59 | Attending: Internal Medicine

## 2019-06-22 ENCOUNTER — Other Ambulatory Visit: Payer: Self-pay

## 2019-06-22 DIAGNOSIS — Z23 Encounter for immunization: Secondary | ICD-10-CM

## 2019-06-22 NOTE — Progress Notes (Signed)
   Covid-19 Vaccination Clinic  Name:  KYANNA MAHRT    MRN: [id number] DOB: [birth year]  06/22/2019  Ms. Koloski was observed post Covid-19 immunization for 15 minutes without incident. She was provided with Vaccine Information Sheet and instruction to access the V-Safe system.   Ms. Magel was instructed to call 911 with any severe reactions post vaccine: Marland Kitchen Difficulty breathing  . Swelling of face and throat  . A fast heartbeat  . A bad rash all over body  . Dizziness and weakness   Immunizations Administered    Name Date Dose VIS Date Route   Pfizer COVID-19 Vaccine 06/22/2019  9:00 AM 0.3 mL 03/15/2019 Intramuscular   Manufacturer: [employer], [employer]   Lot: [id number]   NDC: [address]-2

## 2019-06-24 ENCOUNTER — Other Ambulatory Visit: Payer: Self-pay | Admitting: Nurse Practitioner

## 2019-06-24 DIAGNOSIS — N6001 Solitary cyst of right breast: Secondary | ICD-10-CM

## 2019-06-25 LAB — AUTOIMMUNE PROFILE
Anti Nuclear Antibody (ANA): NEGATIVE
Complement C3, Serum: 153 mg/dL (ref 82–167)
dsDNA Ab: <1 IU/mL (ref 0–9)

## 2019-06-25 LAB — SPECIMEN STATUS REPORT

## 2019-06-26 ENCOUNTER — Ambulatory Visit: Payer: No Typology Code available for payment source

## 2019-07-24 ENCOUNTER — Encounter: Payer: Self-pay | Admitting: Nurse Practitioner

## 2019-07-24 ENCOUNTER — Other Ambulatory Visit: Payer: Self-pay

## 2019-07-24 ENCOUNTER — Ambulatory Visit: Payer: 59 | Admitting: Nurse Practitioner

## 2019-07-24 VITALS — BP 142/90 | HR 103 | Temp 98.1°F | Ht 65.2 in | Wt 193.0 lb

## 2019-07-24 DIAGNOSIS — E119 Type 2 diabetes mellitus without complications: Secondary | ICD-10-CM | POA: Diagnosis not present

## 2019-07-24 DIAGNOSIS — I10 Essential (primary) hypertension: Secondary | ICD-10-CM | POA: Diagnosis not present

## 2019-07-24 DIAGNOSIS — E782 Mixed hyperlipidemia: Secondary | ICD-10-CM

## 2019-07-24 MED ORDER — AMLODIPINE BESYLATE 10 MG PO TABS: 10.0000 mg | ORAL_TABLET | Freq: Every day | ORAL | 1 refills | Status: DC

## 2019-07-24 MED ORDER — METFORMIN HCL ER 750 MG PO TB24: 750.0000 mg | ORAL_TABLET | Freq: Every day | ORAL | 11 refills | Status: DC

## 2019-07-24 NOTE — Progress Notes (Signed)
This visit occurred during the SARS-CoV-2 public health emergency.  Safety protocols were in place, including screening questions prior to the visit, additional usage of staff PPE, and extensive cleaning of exam room while observing appropriate contact time as indicated for disinfecting solutions.  Subjective:     Patient ID: Madeline Blankenship , female    DOB: February 10, 1966 , 53 y.o.   MRN: [id number]   Chief Complaint  Patient presents with  . Diabetes    HPI  Returns today for blood pressure follow up, she has been taking amlodipine 5mg  daily. Her blood pressure continues to be elevated. She is trying to cut back on her high salt foods.   She is also having difficulty with tolerating metformin. She is having diarrhea and GI upset.    Hypertension This is a chronic problem. The current episode started more than 1 year ago. The problem has been gradually improving since onset. The problem is uncontrolled. Pertinent negatives include no anxiety, palpitations or shortness of breath. There are no associated agents to hypertension. Risk factors for coronary artery disease include sedentary lifestyle. Past treatments include lifestyle changes and calcium channel blockers. The current treatment provides mild improvement. There are no compliance problems.  There is no history of angina. There is no history of chronic renal disease.     Past Medical History:  Diagnosis Date  . Diabetes mellitus without complication ([facility])   . Hyperlipidemia   . Hypertension      Family History  Problem Relation Age of Onset  . Diabetes Mother   . Hypertension Mother   . Cancer Mother   . Diabetes Father   . Hypertension Father   . Cancer Father   . Stroke Father   . Vision loss Paternal Aunt      Current Outpatient Medications:  .  amLODipine (NORVASC) 10 MG tablet, Take 1 tablet (10 mg total) by mouth daily., Disp: 90 tablet, Rfl: 1 .  Vitamin D, Ergocalciferol, (DRISDOL) 1.25 MG (50000 UNIT) CAPS  capsule, Take 1 capsule (50,000 Units total) by mouth 2 (two) times a week., Disp: 24 capsule, Rfl: 0 .  metFORMIN (GLUCOPHAGE XR) 750 MG 24 hr tablet, Take 1 tablet (750 mg total) by mouth daily with breakfast., Disp: 30 tablet, Rfl: 11   Allergies  Allergen Reactions  . Codeine Itching     Review of Systems  Constitutional: Negative.   Respiratory: Negative.  Negative for shortness of breath.   Cardiovascular: Negative for palpitations and leg swelling.  Psychiatric/Behavioral: Negative.      Today's Vitals   07/24/19 1533  BP: (!) 142/90  Pulse: (!) 103  Temp: 98.1 F (36.7 C)  TempSrc: Oral  Weight: 193 lb (87.5 kg)  Height: 5' 5.2" (1.656 m)  PainSc: 0-No pain   Body mass index is 31.92 kg/m.   Objective:  Physical Exam Constitutional:      General: She is not in acute distress.    Appearance: She is obese.  Cardiovascular:     Rate and Rhythm: Normal rate and regular rhythm.     Pulses: Normal pulses.     Heart sounds: Normal heart sounds. No murmur.  Pulmonary:     Effort: Pulmonary effort is normal. No respiratory distress.     Breath sounds: Normal breath sounds.  Musculoskeletal:        General: No swelling, tenderness or deformity.  Skin:    General: Skin is warm and dry.     Capillary Refill: Capillary refill takes  less than 2 seconds.  Neurological:     General: No focal deficit present.     Mental Status: She is alert and oriented to person, place, and time.     Cranial Nerves: No cranial nerve deficit.  Psychiatric:        Mood and Affect: Mood normal.        Behavior: Behavior normal.        Thought Content: Thought content normal.        Judgment: Judgment normal.         Assessment And Plan:     1. Type 2 diabetes mellitus without complication, without long-term current use of insulin ([facility])  Chronic, not tolerating metformin well  Will change to XR  Encouraged to continue eating when taking her medications   2. Essential  hypertension . B/P continues to be elevated will increase her amlodipine to 10 mg daily.   . The importance of regular exercise and dietary modification was stressed to the patient.        [name], FNP    THE PATIENT IS ENCOURAGED TO PRACTICE SOCIAL DISTANCING DUE TO THE COVID-19 PANDEMIC.

## 2019-08-21 ENCOUNTER — Encounter: Payer: Self-pay | Admitting: Nurse Practitioner

## 2019-08-21 LAB — HM COLONOSCOPY

## 2019-09-07 ENCOUNTER — Other Ambulatory Visit: Payer: Self-pay | Admitting: Nurse Practitioner

## 2019-09-07 DIAGNOSIS — E559 Vitamin D deficiency, unspecified: Secondary | ICD-10-CM

## 2019-09-07 MED ORDER — VITAMIN D (ERGOCALCIFEROL) 1.25 MG (50000 UNIT) PO CAPS: 50000.0000 [IU] | ORAL_CAPSULE | ORAL | 0 refills | Status: DC

## 2019-09-16 ENCOUNTER — Encounter: Payer: Self-pay | Admitting: Nurse Practitioner

## 2019-09-25 ENCOUNTER — Other Ambulatory Visit: Payer: Self-pay | Admitting: Nurse Practitioner

## 2019-09-25 ENCOUNTER — Other Ambulatory Visit: Payer: Self-pay

## 2019-09-25 ENCOUNTER — Ambulatory Visit
Admission: RE | Admit: 2019-09-25 | Discharge: 2019-09-25 | Disposition: A | Discharge status: Home / Self Care | Payer: 59 | Source: Ambulatory Visit | Attending: Nurse Practitioner | Admitting: Nurse Practitioner

## 2019-09-25 ENCOUNTER — Ambulatory Visit: Payer: 59

## 2019-09-25 DIAGNOSIS — N6001 Solitary cyst of right breast: Secondary | ICD-10-CM

## 2019-09-25 DIAGNOSIS — Z1231 Encounter for screening mammogram for malignant neoplasm of breast: Secondary | ICD-10-CM

## 2019-09-26 ENCOUNTER — Other Ambulatory Visit: Payer: Self-pay

## 2019-09-26 ENCOUNTER — Encounter: Payer: Self-pay | Admitting: Nurse Practitioner

## 2019-09-26 ENCOUNTER — Ambulatory Visit: Payer: 59 | Admitting: Nurse Practitioner

## 2019-09-26 VITALS — BP 146/88 | HR 76 | Temp 98.8°F | Ht 65.4 in | Wt 191.4 lb

## 2019-09-26 DIAGNOSIS — Z1159 Encounter for screening for other viral diseases: Secondary | ICD-10-CM

## 2019-09-26 DIAGNOSIS — E559 Vitamin D deficiency, unspecified: Secondary | ICD-10-CM

## 2019-09-26 DIAGNOSIS — I10 Essential (primary) hypertension: Secondary | ICD-10-CM

## 2019-09-26 DIAGNOSIS — G4700 Insomnia, unspecified: Secondary | ICD-10-CM

## 2019-09-26 DIAGNOSIS — R7309 Other abnormal glucose: Secondary | ICD-10-CM

## 2019-09-26 DIAGNOSIS — R0989 Other specified symptoms and signs involving the circulatory and respiratory systems: Secondary | ICD-10-CM

## 2019-09-26 DIAGNOSIS — R591 Generalized enlarged lymph nodes: Secondary | ICD-10-CM

## 2019-09-26 DIAGNOSIS — Z720 Tobacco use: Secondary | ICD-10-CM

## 2019-09-26 DIAGNOSIS — E782 Mixed hyperlipidemia: Secondary | ICD-10-CM | POA: Diagnosis not present

## 2019-09-26 MED ORDER — NICOTINE 21 MG/24HR TD PT24: 21.0000 mg | MEDICATED_PATCH | TRANSDERMAL | 1 refills | Status: AC

## 2019-09-26 NOTE — Progress Notes (Signed)
This visit occurred during the SARS-CoV-2 public health emergency.  Safety protocols were in place, including screening questions prior to the visit, additional usage of staff PPE, and extensive cleaning of exam room while observing appropriate contact time as indicated for disinfecting solutions.  Subjective:     Patient ID: Madeline Blankenship , female    DOB: 1966/03/26 , 54 y.o.   MRN: [id number]   Chief Complaint  Patient presents with  . Diabetes    HPI  Returns today for blood pressure follow up, she has been taking amlodipine 65m daily. Her blood pressure continues to be elevated. She is trying to cut back on her high salt foods.   She is also having difficulty with tolerating metformin. She is having diarrhea and GI upset.    She ate out last night (burgers).  She has not been drinking water today as well.   Diabetes She presents for her follow-up diabetic visit. She has type 2 diabetes mellitus. There are no hypoglycemic associated symptoms. There are no diabetic associated symptoms. Pertinent negatives for diabetes include no blurred vision. There are no hypoglycemic complications. There are no diabetic complications. Risk factors for coronary artery disease include sedentary lifestyle, obesity, hypertension, dyslipidemia and tobacco exposure. Current diabetic treatment includes oral agent (monotherapy). She is compliant with treatment most of the time. She rarely participates in exercise. An ACE inhibitor/angiotensin II receptor blocker is not being taken. Eye exam is not current.  Hypertension This is a chronic problem. The current episode started more than 1 year ago. The problem has been gradually improving since onset. The problem is uncontrolled. Pertinent negatives include no anxiety, blurred vision, palpitations or shortness of breath. There are no associated agents to hypertension. Risk factors for coronary artery disease include sedentary lifestyle and obesity. Past  treatments include lifestyle changes and calcium channel blockers. The current treatment provides mild improvement. There are no compliance problems.  There is no history of angina. There is no history of chronic renal disease.  Insomnia Primary symptoms: no sleep disturbance.  The problem occurs nightly.     Past Medical History:  Diagnosis Date  . Diabetes mellitus without complication ([place]   . Hyperlipidemia   . Hypertension      Family History  Problem Relation Age of Onset  . Diabetes Mother   . Hypertension Mother   . Cancer Mother   . Diabetes Father   . Hypertension Father   . Cancer Father   . Stroke Father   . Vision loss Paternal Aunt   . Breast cancer Paternal Aunt   . Breast cancer Paternal Aunt      Current Outpatient Medications:  .  amLODipine (NORVASC) 10 MG tablet, Take 1 tablet (10 mg total) by mouth daily., Disp: 90 tablet, Rfl: 1 .  metFORMIN (GLUCOPHAGE XR) 750 MG 24 hr tablet, Take 1 tablet (750 mg total) by mouth daily with breakfast., Disp: 30 tablet, Rfl: 11 .  Vitamin D, Ergocalciferol, (DRISDOL) 1.25 MG (50000 UNIT) CAPS capsule, Take 1 capsule (50,000 Units total) by mouth 2 (two) times a week., Disp: 24 capsule, Rfl: 0   Allergies  Allergen Reactions  . Codeine Itching     Review of Systems  Eyes: Negative for blurred vision.  Respiratory: Negative for shortness of breath.   Cardiovascular: Negative for palpitations.  Psychiatric/Behavioral: Negative for sleep disturbance. The patient has insomnia.      Today's Vitals   09/26/19 1429  BP: (!) 146/88  Pulse: 76  Temp:  98.8 F (37.1 C)  TempSrc: Oral  Weight: 191 lb 6.4 oz (86.8 kg)  Height: 5' 5.4" (1.661 m)  PainSc: 0-No pain   Body mass index is 31.46 kg/m.   Objective:  Physical Exam Vitals reviewed.  Constitutional:      General: She is not in acute distress.    Appearance: Normal appearance. She is well-developed. She is obese.  Cardiovascular:     Rate and Rhythm:  Normal rate and regular rhythm.     Pulses: Normal pulses.     Heart sounds: Normal heart sounds. No murmur heard.   Pulmonary:     Effort: Pulmonary effort is normal.     Breath sounds: Normal breath sounds.  Chest:     Chest wall: No tenderness.  Musculoskeletal:        General: Normal range of motion.  Skin:    General: Skin is warm and dry.     Capillary Refill: Capillary refill takes less than 2 seconds.  Neurological:     General: No focal deficit present.     Mental Status: She is alert and oriented to person, place, and time.  Psychiatric:        Mood and Affect: Mood normal.        Behavior: Behavior normal.        Thought Content: Thought content normal.        Judgment: Judgment normal.         Assessment And Plan:     1. Essential hypertension  Chronic, elevated today she is to take her amlodipine  Encouraged to avoid high salt foods.  2. Vitamin D deficiency  Will check vitamin D level and supplement as needed.     Also encouraged to spend 15 minutes in the sun daily.  - Vitamin D (25 hydroxy)  3. Mixed hyperlipidemia  Chronic, elevated at last visit.  Will recheck today to see if improved.  - Lipid panel  4. Encounter for hepatitis C screening test for low risk patient  Will check Hepatitis C screening due to recent recommendations to screen all adults 18 years and older - Hepatitis C antibody  5. Tenderness of lymph node  Will check CBC for infection/virus - CBC no Diff  6. Tobacco abuse  She is ready to stop smoking sent Rx for nicotine patches - nicotine (NICODERM CQ - DOSED IN MG/24 HOURS) 21 mg/24hr patch; Place 1 patch (21 mg total) onto the skin daily.  Dispense: 30 patch; Refill: 1  7. Abnormal glucose Chronic, controlled Not tolerating metformin well will change to XR Encouraged to limit intake of sugary foods and drinks Encouraged to increase physical activity to 150 minutes per week - CMP14+EGFR - Hemoglobin A1c  8.  Insomnia, unspecified type  She is to take magnesium over the counter and consider taking melatonin   The problem of insomnia is discussed. Avoidance of caffeine sources is strongly encouraged. Sleep hygiene issues are reviewed.    [name], FNP    THE PATIENT IS ENCOURAGED TO PRACTICE SOCIAL DISTANCING DUE TO THE COVID-19 PANDEMIC.

## 2019-09-27 LAB — CMP14+EGFR
ALT: 12 IU/L (ref 0–32)
AST: 12 IU/L (ref 0–40)
Albumin/Globulin Ratio: 1.6 (ref 1.2–2.2)
Albumin: 4.3 g/dL (ref 3.8–4.9)
Alkaline Phosphatase: 106 IU/L (ref 48–121)
BUN/Creatinine Ratio: 13 (ref 9–23)
BUN: 11 mg/dL (ref 6–24)
Bilirubin Total: <0.2 mg/dL (ref 0.0–1.2)
CO2: 25 mmol/L (ref 20–29)
Calcium: 9.5 mg/dL (ref 8.7–10.2)
Chloride: 105 mmol/L (ref 96–106)
Creatinine, Ser: 0.88 mg/dL (ref 0.57–1.00)
GFR calc Af Amer: 88 mL/min/{1.73_m2} (ref >59)
GFR calc non Af Amer: 76 mL/min/{1.73_m2} (ref >59)
Globulin, Total: 2.7 g/dL (ref 1.5–4.5)
Glucose: 83 mg/dL (ref 65–99)
Potassium: 4.5 mmol/L (ref 3.5–5.2)
Sodium: 142 mmol/L (ref 134–144)
Total Protein: 7 g/dL (ref 6.0–8.5)

## 2019-09-27 LAB — CBC
Hematocrit: 42.3 % (ref 34.0–46.6)
Hemoglobin: 13.8 g/dL (ref 11.1–15.9)
MCH: 29.4 pg (ref 26.6–33.0)
MCHC: 32.6 g/dL (ref 31.5–35.7)
MCV: 90 fL (ref 79–97)
Platelets: 350 10*3/uL (ref 150–450)
RBC: 4.7 x10E6/uL (ref 3.77–5.28)
RDW: 12.7 % (ref 11.7–15.4)
WBC: 8.9 10*3/uL (ref 3.4–10.8)

## 2019-09-27 LAB — LIPID PANEL
Chol/HDL Ratio: 6.1 ratio — ABNORMAL HIGH (ref 0.0–4.4)
Cholesterol, Total: 239 mg/dL — ABNORMAL HIGH (ref 100–199)
HDL: 39 mg/dL — ABNORMAL LOW (ref >39)
LDL Chol Calc (NIH): 177 mg/dL — ABNORMAL HIGH (ref 0–99)
Triglycerides: 127 mg/dL (ref 0–149)
VLDL Cholesterol Cal: 23 mg/dL (ref 5–40)

## 2019-09-27 LAB — HEMOGLOBIN A1C
Est. average glucose Bld gHb Est-mCnc: 131 mg/dL
Hgb A1c MFr Bld: 6.2 % — ABNORMAL HIGH (ref 4.8–5.6)

## 2019-09-27 LAB — VITAMIN D 25 HYDROXY (VIT D DEFICIENCY, FRACTURES): Vit D, 25-Hydroxy: 38.6 ng/mL (ref 30.0–100.0)

## 2019-09-27 LAB — HEPATITIS C ANTIBODY: Hep C Virus Ab: <0.1 s/co ratio (ref 0.0–0.9)

## 2019-09-30 ENCOUNTER — Other Ambulatory Visit: Payer: Self-pay | Admitting: Nurse Practitioner

## 2019-09-30 DIAGNOSIS — R928 Other abnormal and inconclusive findings on diagnostic imaging of breast: Secondary | ICD-10-CM

## 2019-09-30 MED ORDER — ATORVASTATIN CALCIUM 20 MG PO TABS: 20.0000 mg | ORAL_TABLET | Freq: Every day | ORAL | 11 refills | Status: DC

## 2019-10-09 NOTE — Progress Notes (Signed)
Rybelsus which is a pill or ozempic which is an injection weekly?

## 2019-10-10 ENCOUNTER — Other Ambulatory Visit: Payer: Self-pay | Admitting: Nurse Practitioner

## 2019-10-10 ENCOUNTER — Ambulatory Visit
Admission: RE | Admit: 2019-10-10 | Discharge: 2019-10-10 | Disposition: A | Discharge status: Home / Self Care | Payer: 59 | Source: Ambulatory Visit | Attending: Nurse Practitioner | Admitting: Nurse Practitioner

## 2019-10-10 ENCOUNTER — Other Ambulatory Visit: Payer: Self-pay

## 2019-10-10 DIAGNOSIS — R928 Other abnormal and inconclusive findings on diagnostic imaging of breast: Secondary | ICD-10-CM

## 2019-10-10 MED ORDER — RYBELSUS 7 MG PO TABS: 1.0000 | ORAL_TABLET | Freq: Every day | ORAL | 2 refills | Status: DC

## 2019-10-10 NOTE — Progress Notes (Signed)
Okay I will send the 7mg  dose and make sure she has a discount card

## 2019-10-20 ENCOUNTER — Encounter: Payer: Self-pay | Admitting: Nurse Practitioner

## 2019-12-31 ENCOUNTER — Encounter: Payer: 59 | Admitting: Nurse Practitioner

## 2020-04-08 ENCOUNTER — Ambulatory Visit: Payer: Self-pay | Attending: Internal Medicine

## 2020-04-08 ENCOUNTER — Other Ambulatory Visit (HOSPITAL_COMMUNITY): Payer: Self-pay | Admitting: Internal Medicine

## 2020-04-08 DIAGNOSIS — Z23 Encounter for immunization: Secondary | ICD-10-CM

## 2020-04-08 NOTE — Progress Notes (Signed)
   Covid-19 Vaccination Clinic  Name:  Lyllie Cobbins    MRN: [id number] DOB: 1965/06/19  04/08/2020  Ms. Bump was observed post Covid-19 immunization for 15 minutes without incident. She was provided with Vaccine Information Sheet and instruction to access the V-Safe system.   Ms. Kastelic was instructed to call 911 with any severe reactions post vaccine: Marland Kitchen Difficulty breathing  . Swelling of face and throat  . A fast heartbeat  . A bad rash all over body  . Dizziness and weakness   Immunizations Administered    Name Date Dose VIS Date Route   Pfizer COVID-19 Vaccine 04/08/2020 12:50 PM 0.3 mL 01/22/2020 Intramuscular   Manufacturer: [employer], [employer]   Lot: [id number]   NDC: [address]-3

## 2021-06-24 DIAGNOSIS — Z0000 Encounter for general adult medical examination without abnormal findings: Secondary | ICD-10-CM | POA: Diagnosis not present

## 2021-06-24 DIAGNOSIS — E119 Type 2 diabetes mellitus without complications: Secondary | ICD-10-CM | POA: Insufficient documentation

## 2021-06-24 DIAGNOSIS — I10 Essential (primary) hypertension: Secondary | ICD-10-CM | POA: Insufficient documentation

## 2021-06-24 DIAGNOSIS — E782 Mixed hyperlipidemia: Secondary | ICD-10-CM | POA: Insufficient documentation

## 2021-06-24 DIAGNOSIS — Z13228 Encounter for screening for other metabolic disorders: Secondary | ICD-10-CM | POA: Diagnosis not present

## 2021-06-24 DIAGNOSIS — Z131 Encounter for screening for diabetes mellitus: Secondary | ICD-10-CM | POA: Diagnosis not present

## 2021-11-17 ENCOUNTER — Encounter: Payer: Self-pay | Admitting: Nurse Practitioner

## 2021-11-17 ENCOUNTER — Other Ambulatory Visit: Payer: Self-pay

## 2021-11-17 ENCOUNTER — Other Ambulatory Visit: Payer: Self-pay | Admitting: Nurse Practitioner

## 2021-11-17 ENCOUNTER — Ambulatory Visit (INDEPENDENT_AMBULATORY_CARE_PROVIDER_SITE_OTHER): Payer: 59 | Admitting: Nurse Practitioner

## 2021-11-17 VITALS — BP 168/100 | HR 100 | Temp 98.8°F | Ht 61.4 in | Wt 166.0 lb

## 2021-11-17 DIAGNOSIS — E6609 Other obesity due to excess calories: Secondary | ICD-10-CM

## 2021-11-17 DIAGNOSIS — E1165 Type 2 diabetes mellitus with hyperglycemia: Secondary | ICD-10-CM | POA: Diagnosis not present

## 2021-11-17 DIAGNOSIS — I10 Essential (primary) hypertension: Secondary | ICD-10-CM | POA: Diagnosis not present

## 2021-11-17 DIAGNOSIS — Z114 Encounter for screening for human immunodeficiency virus [HIV]: Secondary | ICD-10-CM

## 2021-11-17 DIAGNOSIS — Z6830 Body mass index (BMI) 30.0-30.9, adult: Secondary | ICD-10-CM

## 2021-11-17 DIAGNOSIS — E782 Mixed hyperlipidemia: Secondary | ICD-10-CM

## 2021-11-17 DIAGNOSIS — R29810 Facial weakness: Secondary | ICD-10-CM

## 2021-11-17 DIAGNOSIS — R42 Dizziness and giddiness: Secondary | ICD-10-CM

## 2021-11-17 MED ORDER — AMLODIPINE BESYLATE 10 MG PO TABS: 10.0000 mg | ORAL_TABLET | Freq: Every day | ORAL | 1 refills | Status: DC

## 2021-11-17 MED ORDER — BLOOD GLUCOSE MONITOR KIT: PACK | 0 refills | Status: DC

## 2021-11-17 MED ORDER — ACCU-CHEK GUIDE VI STRP: ORAL_STRIP | 0 refills | Status: DC

## 2021-11-17 MED ORDER — ACCU-CHEK SOFTCLIX LANCETS MISC: 0 refills | Status: DC

## 2021-11-17 NOTE — Patient Instructions (Signed)
Hypertension, Adult High blood pressure (hypertension) is when the force of blood pumping through the arteries is too strong. The arteries are the blood vessels that carry blood from the heart throughout the body. Hypertension forces the heart to work harder to pump blood and may cause arteries to become narrow or stiff. Untreated or uncontrolled hypertension can lead to a heart attack, heart failure, a stroke, kidney disease, and other problems. A blood pressure reading consists of a higher number over a lower number. Ideally, your blood pressure should be below 120/80. The first ("top") number is called the systolic pressure. It is a measure of the pressure in your arteries as your heart beats. The second ("bottom") number is called the diastolic pressure. It is a measure of the pressure in your arteries as the heart relaxes. What are the causes? The exact cause of this condition is not known. There are some conditions that result in high blood pressure. What increases the risk? Certain factors may make you more likely to develop high blood pressure. Some of these risk factors are under your control, including: Smoking. Not getting enough exercise or physical activity. Being overweight. Having too much fat, sugar, calories, or salt (sodium) in your diet. Drinking too much alcohol. Other risk factors include: Having a personal history of heart disease, diabetes, high cholesterol, or kidney disease. Stress. Having a family history of high blood pressure and high cholesterol. Having obstructive sleep apnea. Age. The risk increases with age. What are the signs or symptoms? High blood pressure may not cause symptoms. Very high blood pressure (hypertensive crisis) may cause: Headache. Fast or irregular heartbeats (palpitations). Shortness of breath. Nosebleed. Nausea and vomiting. Vision changes. Severe chest pain, dizziness, and seizures. How is this diagnosed? This condition is diagnosed by  measuring your blood pressure while you are seated, with your arm resting on a flat surface, your legs uncrossed, and your feet flat on the floor. The cuff of the blood pressure monitor will be placed directly against the skin of your upper arm at the level of your heart. Blood pressure should be measured at least twice using the same arm. Certain conditions can cause a difference in blood pressure between your right and left arms. If you have a high blood pressure reading during one visit or you have normal blood pressure with other risk factors, you may be asked to: Return on a different day to have your blood pressure checked again. Monitor your blood pressure at home for 1 week or longer. If you are diagnosed with hypertension, you may have other blood or imaging tests to help your health care provider understand your overall risk for other conditions. How is this treated? This condition is treated by making healthy lifestyle changes, such as eating healthy foods, exercising more, and reducing your alcohol intake. You may be referred for counseling on a healthy diet and physical activity. Your health care provider may prescribe medicine if lifestyle changes are not enough to get your blood pressure under control and if: Your systolic blood pressure is above 130. Your diastolic blood pressure is above 80. Your personal target blood pressure may vary depending on your medical conditions, your age, and other factors. Follow these instructions at home: Eating and drinking  Eat a diet that is high in fiber and potassium, and low in sodium, added sugar, and fat. An example of this eating plan is called the DASH diet. DASH stands for Dietary Approaches to Stop Hypertension. To eat this way: Eat   plenty of fresh fruits and vegetables. Try to fill one half of your plate at each meal with fruits and vegetables. Eat whole grains, such as whole-wheat pasta, brown rice, or whole-grain bread. Fill about one  fourth of your plate with whole grains. Eat or drink low-fat dairy products, such as skim milk or low-fat yogurt. Avoid fatty cuts of meat, processed or cured meats, and poultry with skin. Fill about one fourth of your plate with lean proteins, such as fish, chicken without skin, beans, eggs, or tofu. Avoid pre-made and processed foods. These tend to be higher in sodium, added sugar, and fat. Reduce your daily sodium intake. Many people with hypertension should eat less than 1,500 mg of sodium a day. Do not drink alcohol if: Your health care provider tells you not to drink. You are pregnant, may be pregnant, or are planning to become pregnant. If you drink alcohol: Limit how much you have to: 0-1 drink a day for women. 0-2 drinks a day for men. Know how much alcohol is in your drink. In the [place] one drink equals one 12 oz bottle of beer (355 mL), one 5 oz glass of wine (148 mL), or one 1 oz glass of hard liquor (44 mL). Lifestyle  Work with your health care provider to maintain a healthy body weight or to lose weight. Ask what an ideal weight is for you. Get at least 30 minutes of exercise that causes your heart to beat faster (aerobic exercise) most days of the week. Activities may include walking, swimming, or biking. Include exercise to strengthen your muscles (resistance exercise), such as Pilates or lifting weights, as part of your weekly exercise routine. Try to do these types of exercises for 30 minutes at least 3 days a week. Do not use any products that contain nicotine or tobacco. These products include cigarettes, chewing tobacco, and vaping devices, such as e-cigarettes. If you need help quitting, ask your health care provider. Monitor your blood pressure at home as told by your health care provider. Keep all follow-up visits. This is important. Medicines Take over-the-counter and prescription medicines only as told by your health care provider. Follow directions carefully. Blood  pressure medicines must be taken as prescribed. Do not skip doses of blood pressure medicine. Doing this puts you at risk for problems and can make the medicine less effective. Ask your health care provider about side effects or reactions to medicines that you should watch for. Contact a health care provider if you: Think you are having a reaction to a medicine you are taking. Have headaches that keep coming back (recurring). Feel dizzy. Have swelling in your ankles. Have trouble with your vision. Get help right away if you: Develop a severe headache or confusion. Have unusual weakness or numbness. Feel faint. Have severe pain in your chest or abdomen. Vomit repeatedly. Have trouble breathing. These symptoms may be an emergency. Get help right away. Call 911. Do not wait to see if the symptoms will go away. Do not drive yourself to the hospital. Summary Hypertension is when the force of blood pumping through your arteries is too strong. If this condition is not controlled, it may put you at risk for serious complications. Your personal target blood pressure may vary depending on your medical conditions, your age, and other factors. For most people, a normal blood pressure is less than 120/80. Hypertension is treated with lifestyle changes, medicines, or a combination of both. Lifestyle changes include losing weight, eating a healthy,   low-sodium diet, exercising more, and limiting alcohol. This information is not intended to replace advice given to you by your health care provider. Make sure you discuss any questions you have with your health care provider. Document Revised: 01/26/2021 Document Reviewed: 01/26/2021 Elsevier Patient Education  2023 [employer]  

## 2021-11-17 NOTE — Progress Notes (Signed)
I,Madeline Blankenship,acting as a scribe for Madeline Moore, FNP.,have documented all relevant documentation on the behalf of Madeline Moore, FNP,as directed by  Madeline Moore, FNP while in the presence of Madeline Moore, FNP.  Subjective:     Patient ID: Madeline Blankenship , female    DOB: 05/02/1965 , 56 y.o.   MRN: [id number]   Chief Complaint  Patient presents with   Hypertension    HPI  Patient presents today for BPC. She was unable to afford her insurance. Has not been seen in about 2 years. She has lost approximately 30 lbs since her last visit. She has not taken medications for an extended period of time. She was at   She had been having a headache for 3 weeks prior, with some vision disturbance before going to a health fair and her blood pressure was 190/180 she declined going to the hospital. She took tylenol for her headache did not let up until the night before the health fair. She reports having mild stroke after her mothers death. She has twisting to her mouth for 1/2 the day and she had slurred speech - resolved within a 24 hour period. She did notice the mouth drooping earlier that morning. Occasionally will have a "jittery" feeling lasting a few minutes.   Hypertension This is a chronic problem. The current episode started more than 1 year ago. The problem has been gradually worsening since onset. The problem is uncontrolled. Pertinent negatives include no anxiety, blurred vision, chest pain or palpitations. There are no associated agents to hypertension. Risk factors for coronary artery disease include sedentary lifestyle. Past treatments include calcium channel blockers and ACE inhibitors. There is no history of angina. There is no history of chronic renal disease.     Past Medical History:  Diagnosis Date   Diabetes mellitus without complication (HCC)    Hyperlipidemia    Hypertension      Family History  Problem Relation Age of Onset   Diabetes Mother    Hypertension  Mother    Cancer Mother    Diabetes Father    Hypertension Father    Cancer Father    Stroke Father    Vision loss Paternal Aunt    Breast cancer Paternal Aunt    Breast cancer Paternal Aunt      Current Outpatient Medications:    Accu-Chek Softclix Lancets lancets, USE UP TO 4 TIMES DAILY AS DIRECTED, Disp: 100 each, Rfl: 0   amLODipine (NORVASC) 10 MG tablet, Take 1 tablet (10 mg total) by mouth daily., Disp: 90 tablet, Rfl: 1   glucose blood (ACCU-CHEK GUIDE) test strip, Use as instructed, Disp: 100 each, Rfl: 0   Allergies  Allergen Reactions   Codeine Itching     Review of Systems  Constitutional: Negative.   Eyes:  Negative for blurred vision.  Respiratory: Negative.    Cardiovascular: Negative.  Negative for chest pain, palpitations and leg swelling.  Gastrointestinal: Negative.   Endocrine: Positive for polydipsia, polyphagia and polyuria.  Skin:  Positive for wound.  Neurological: Negative.   Psychiatric/Behavioral: Negative.       Today's Vitals   11/17/21 1412 11/17/21 1507  BP: (!) 170/100 (!) 168/100  Pulse: 100   Temp: 98.8 F (37.1 C)   TempSrc: Oral   Weight: 166 lb (75.3 kg)   Height: 5' 1.4" (1.56 m)    Body mass index is 30.96 kg/m.  Wt Readings from Last 3 Encounters:  11/17/21 166 lb (75.3 kg)  09/26/19   191 lb 6.4 oz (86.8 kg)  07/24/19 193 lb (87.5 kg)    Objective:  Physical Exam Vitals reviewed.  Constitutional:      General: She is not in acute distress.    Appearance: Normal appearance.  Cardiovascular:     Rate and Rhythm: Normal rate and regular rhythm.     Pulses: Normal pulses.     Heart sounds: Normal heart sounds. No murmur heard. Pulmonary:     Effort: Pulmonary effort is normal. No respiratory distress.     Breath sounds: Normal breath sounds. No wheezing.  Neurological:     General: No focal deficit present.     Mental Status: She is alert and oriented to person, place, and time.     Cranial Nerves: No cranial  nerve deficit.     Motor: No weakness.  Psychiatric:        Mood and Affect: Mood normal.        Behavior: Behavior normal.        Thought Content: Thought content normal.        Judgment: Judgment normal.         Assessment And Plan:     1. Uncontrolled type 2 diabetes mellitus with hyperglycemia (HCC) Comments: Has not been seen in 2 years, will check HgbA1c, not currently taking any medications.  - Hemoglobin A1c - Microalbumin / Creatinine Urine Ratio - Renal function panel with eGFR - CMP14+EGFR - Ambulatory referral to Podiatry - Ambulatory referral to Ophthalmology  2. Uncontrolled hypertension Comments: I have stressed the importance of being adherent to medications and when blood pressure significantly elevated to seek Urgent/emergent care especially with symptoms. Will restart amlodipine will likely need to add another medication. Blood pressure remains elevated with repeat. EKG done HR 86 - Microalbumin / Creatinine Urine Ratio - Renal function panel with eGFR - CMP14+EGFR - amLODipine (NORVASC) 10 MG tablet; Take 1 tablet (10 mg total) by mouth daily.  Dispense: 90 tablet; Refill: 1 - EKG 12-Lead  3. Screening for HIV without presence of risk factors - HIV Antibody (routine testing w rflx)  4. Class 1 obesity due to excess calories with serious comorbidity and body mass index (BMI) of 30.0 to 30.9 in adult Chronic Discussed healthy diet and regular exercise options  Encouraged to exercise at least 150 minutes per week with 2 days of strength training  5. Mixed hyperlipidemia Will check lipid panel - Lipid panel  6. Facial droop Comments: She has a slight facial droop on the left side in comparison to her Drivers license. I will check a CT scan of brain especially since has dizziness - CT HEAD WO CONTRAST (5MM); Future - TSH  7. Dizziness Comments: May be related to her poor control of hypertension, encouraged to stay well hydrated with water and take  medications as directed - TSH    Patient was given opportunity to ask questions. Patient verbalized understanding of the plan and was able to repeat key elements of the plan. All questions were answered to their satisfaction.  Madeline Moore, FNP   I, Madeline Moore, FNP, have reviewed all documentation for this visit. The documentation on 11/17/21 for the exam, diagnosis, procedures, and orders are all accurate and complete.   IF YOU HAVE BEEN REFERRED TO A SPECIALIST, IT MAY TAKE 1-2 WEEKS TO SCHEDULE/PROCESS THE REFERRAL. IF YOU HAVE NOT HEARD FROM US/SPECIALIST IN TWO WEEKS, PLEASE GIVE US A CALL AT [phone].   THE PATIENT IS ENCOURAGED TO PRACTICE   SOCIAL DISTANCING DUE TO THE COVID-19 PANDEMIC.

## 2021-11-18 ENCOUNTER — Other Ambulatory Visit: Payer: Self-pay

## 2021-11-18 DIAGNOSIS — E1165 Type 2 diabetes mellitus with hyperglycemia: Secondary | ICD-10-CM

## 2021-11-18 LAB — CMP14+EGFR
ALT: 6 IU/L (ref 0–32)
AST: 11 IU/L (ref 0–40)
Albumin/Globulin Ratio: 1.7 (ref 1.2–2.2)
Albumin: 4.2 g/dL (ref 3.8–4.9)
Alkaline Phosphatase: 99 IU/L (ref 44–121)
BUN/Creatinine Ratio: 14 (ref 9–23)
BUN: 13 mg/dL (ref 6–24)
Bilirubin Total: 0.2 mg/dL (ref 0.0–1.2)
CO2: 22 mmol/L (ref 20–29)
Calcium: 9.3 mg/dL (ref 8.7–10.2)
Chloride: 104 mmol/L (ref 96–106)
Creatinine, Ser: 0.92 mg/dL (ref 0.57–1.00)
Globulin, Total: 2.5 g/dL (ref 1.5–4.5)
Glucose: 77 mg/dL (ref 70–99)
Potassium: 4.4 mmol/L (ref 3.5–5.2)
Sodium: 141 mmol/L (ref 134–144)
Total Protein: 6.7 g/dL (ref 6.0–8.5)
eGFR: 74 mL/min/{1.73_m2} (ref >59)

## 2021-11-18 LAB — LIPID PANEL
Chol/HDL Ratio: 5.2 ratio — ABNORMAL HIGH (ref 0.0–4.4)
Cholesterol, Total: 201 mg/dL — ABNORMAL HIGH (ref 100–199)
HDL: 39 mg/dL — ABNORMAL LOW (ref >39)
LDL Chol Calc (NIH): 142 mg/dL — ABNORMAL HIGH (ref 0–99)
Triglycerides: 108 mg/dL (ref 0–149)
VLDL Cholesterol Cal: 20 mg/dL (ref 5–40)

## 2021-11-18 LAB — MICROALBUMIN / CREATININE URINE RATIO
Creatinine, Urine: 127.2 mg/dL
Microalb/Creat Ratio: 10 mg/g creat (ref 0–29)
Microalbumin, Urine: 13 ug/mL

## 2021-11-18 LAB — TSH: TSH: 1.3 u[IU]/mL (ref 0.450–4.500)

## 2021-11-18 LAB — RENAL FUNCTION PANEL: Phosphorus: 3.3 mg/dL (ref 3.0–4.3)

## 2021-11-18 LAB — HIV ANTIBODY (ROUTINE TESTING W REFLEX): HIV Screen 4th Generation wRfx: NONREACTIVE

## 2021-11-18 LAB — HEMOGLOBIN A1C
Est. average glucose Bld gHb Est-mCnc: 120 mg/dL
Hgb A1c MFr Bld: 5.8 % — ABNORMAL HIGH (ref 4.8–5.6)

## 2021-11-18 MED ORDER — ACCU-CHEK GUIDE VI STRP: ORAL_STRIP | 0 refills | Status: AC

## 2021-11-18 MED ORDER — ACCU-CHEK SOFTCLIX LANCETS MISC: 0 refills | Status: AC

## 2021-11-30 ENCOUNTER — Ambulatory Visit: Payer: 59

## 2021-11-30 VITALS — BP 128/70 | Temp 99.5°F

## 2021-11-30 DIAGNOSIS — I10 Essential (primary) hypertension: Secondary | ICD-10-CM

## 2021-11-30 NOTE — Progress Notes (Signed)
Patient presents today for BPC. She is currently taking Amlodipine 10mg .   BP Readings from Last 3 Encounters:  11/30/21 128/70  11/17/21 (!) 168/100  09/26/19 (!) 146/88   Pt will continue with current medications. She will follow up with provider as scheduled.

## 2021-12-02 ENCOUNTER — Ambulatory Visit: Payer: 59 | Admitting: Podiatry

## 2021-12-02 ENCOUNTER — Encounter: Payer: Self-pay | Admitting: Podiatry

## 2021-12-02 VITALS — BP 177/106 | HR 90 | Temp 99.4°F

## 2021-12-02 DIAGNOSIS — B351 Tinea unguium: Secondary | ICD-10-CM | POA: Diagnosis not present

## 2021-12-02 DIAGNOSIS — L84 Corns and callosities: Secondary | ICD-10-CM

## 2021-12-02 DIAGNOSIS — E1142 Type 2 diabetes mellitus with diabetic polyneuropathy: Secondary | ICD-10-CM

## 2021-12-02 DIAGNOSIS — L851 Acquired keratosis [keratoderma] palmaris et plantaris: Secondary | ICD-10-CM | POA: Diagnosis not present

## 2021-12-04 NOTE — Progress Notes (Signed)
  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 She is currently taking antibiotics for this    56 y.o. female presents with the above complaint. History confirmed with patient. Patient presents for foot check as she has history DM2. She does report that mulch sometimes gets in her shoes and wants to ensure no concern for foreign body. She does think if she steps on something sharp she would feel it. Denies going barefoot. Does report callus on bottom of both feet. does trim her own nails without trouble. DM controlled well at this time last A1c  5.8.   Objective:  Physical Exam: warm, good capillary refill, nail exam normal nails without lesions, no trophic changes or ulcerative lesions. There are porokeratosis present bilateral foot sub 5th MPJ no underlying wound. DP pulses palpable, PT pulses palpable, and protective sensation intact Left Foot: normal exam, no swelling, tenderness, instability; ligaments intact, full range of motion of all ankle/foot joints  Right Foot: normal exam, no swelling, tenderness, instability; ligaments intact, full range of motion of all ankle/foot joints   No images are attached to the encounter.  Assessment:   1. Plantar porokeratosis, acquired   2. Callus of foot   3. Type 2 diabetes mellitus with diabetic polyneuropathy, without long-term current use of insulin (HCC)   4. Tinea unguium      Plan:  Patient was evaluated and treated and all questions answered.  Diabetes -Educated on DM Footcare. Discussed routine foot checks. Patient does not have evidence of pre ulcerative lesions. Nails without significant thickening or elongation. Patient  instructed not to go barefoot. Call with any new questions or concerns regarding either foot. Follow up for annual DM foot exam.  IPK - excisionally debrided porokeratosis plantar 5th mpj with 10 blade to healthy tissue level, instructed to used lotion on feet to prevent buildup of hyperkeratotic tissue.  Return in about 1 year (around 12/03/2022) for diabetic foot exam.

## 2021-12-14 ENCOUNTER — Other Ambulatory Visit: Payer: Self-pay

## 2022-01-02 IMAGING — MG MM DIGITAL DIAGNOSTIC UNILAT*R* W/ TOMO W/ CAD
4 series · 4 of 12 positions shown · non-contrast
Comparison: Previous exam(s).

CLINICAL DATA: Patient recalled from screening for right breast
mass.

EXAM:
DIGITAL DIAGNOSTIC RIGHT MAMMOGRAM WITH CAD AND TOMO
ULTRASOUND RIGHT BREAST

[R MLO synth-2D]
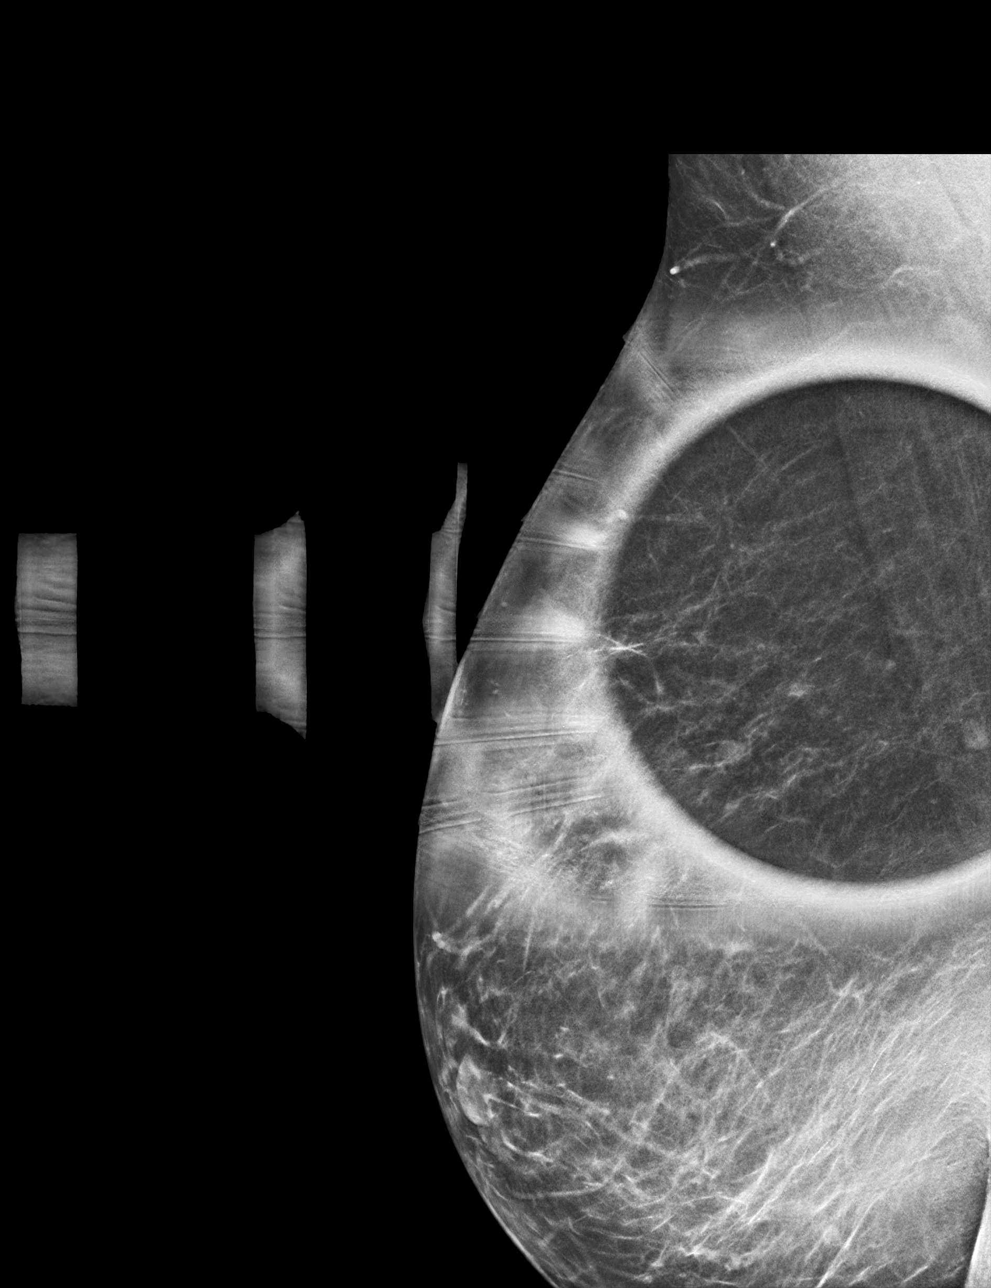

[R CC synth-2D]
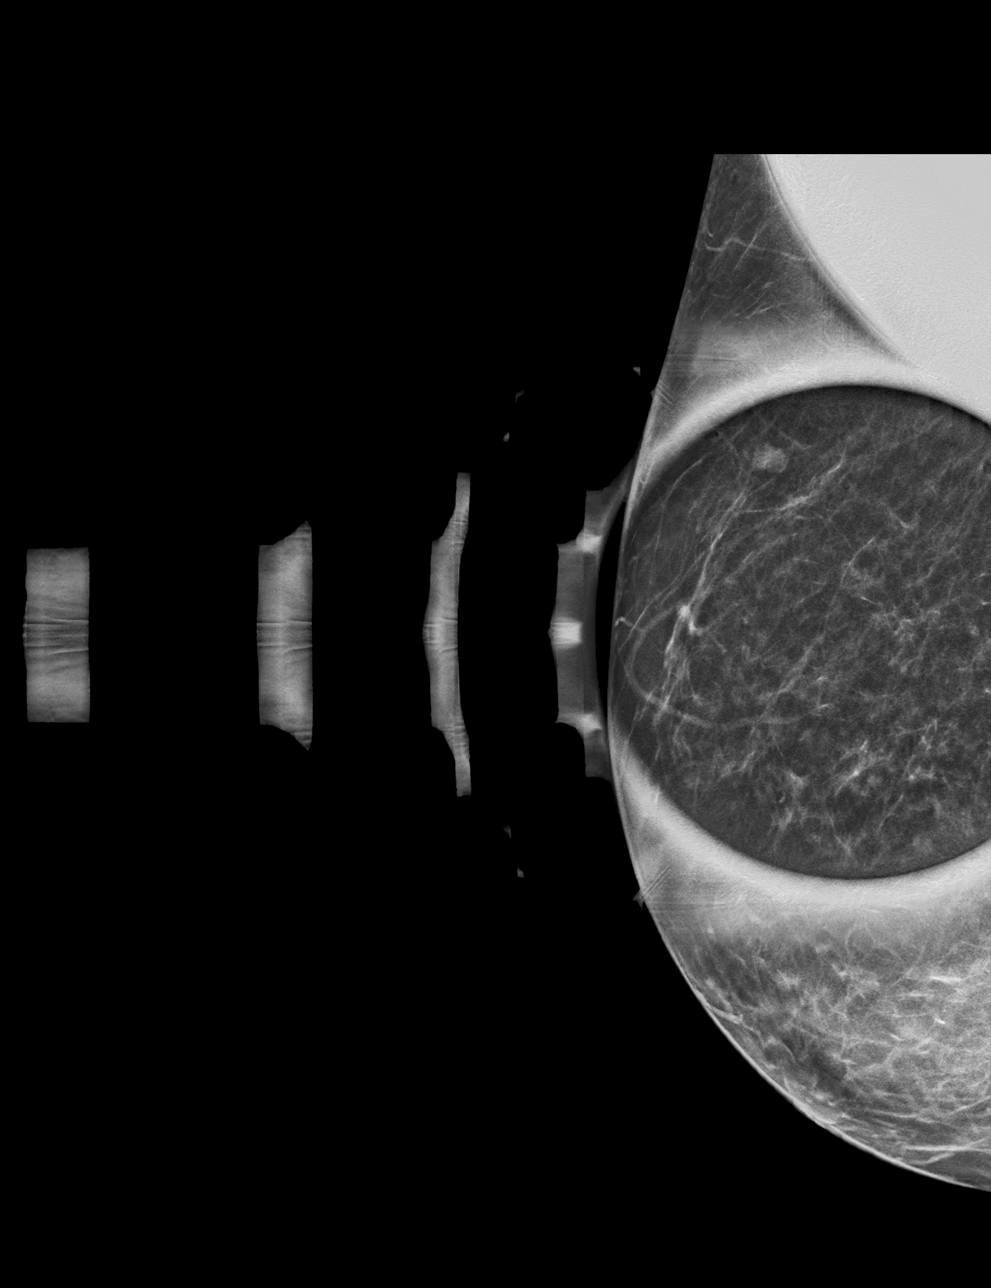

[R MLO tomo · tomo slice 29/56.0]
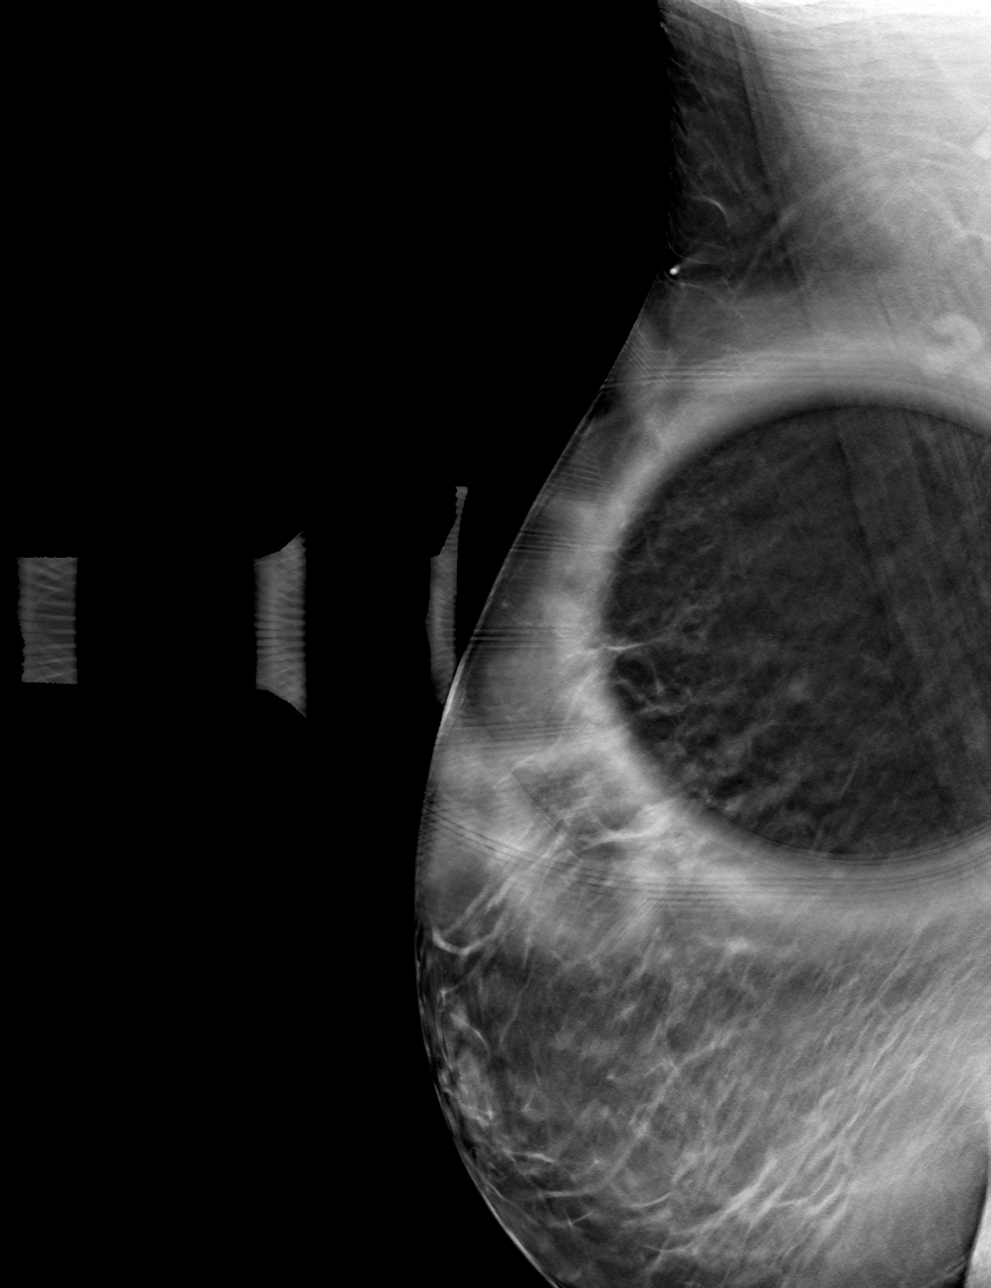

[R CC tomo · tomo slice 23/46.0]
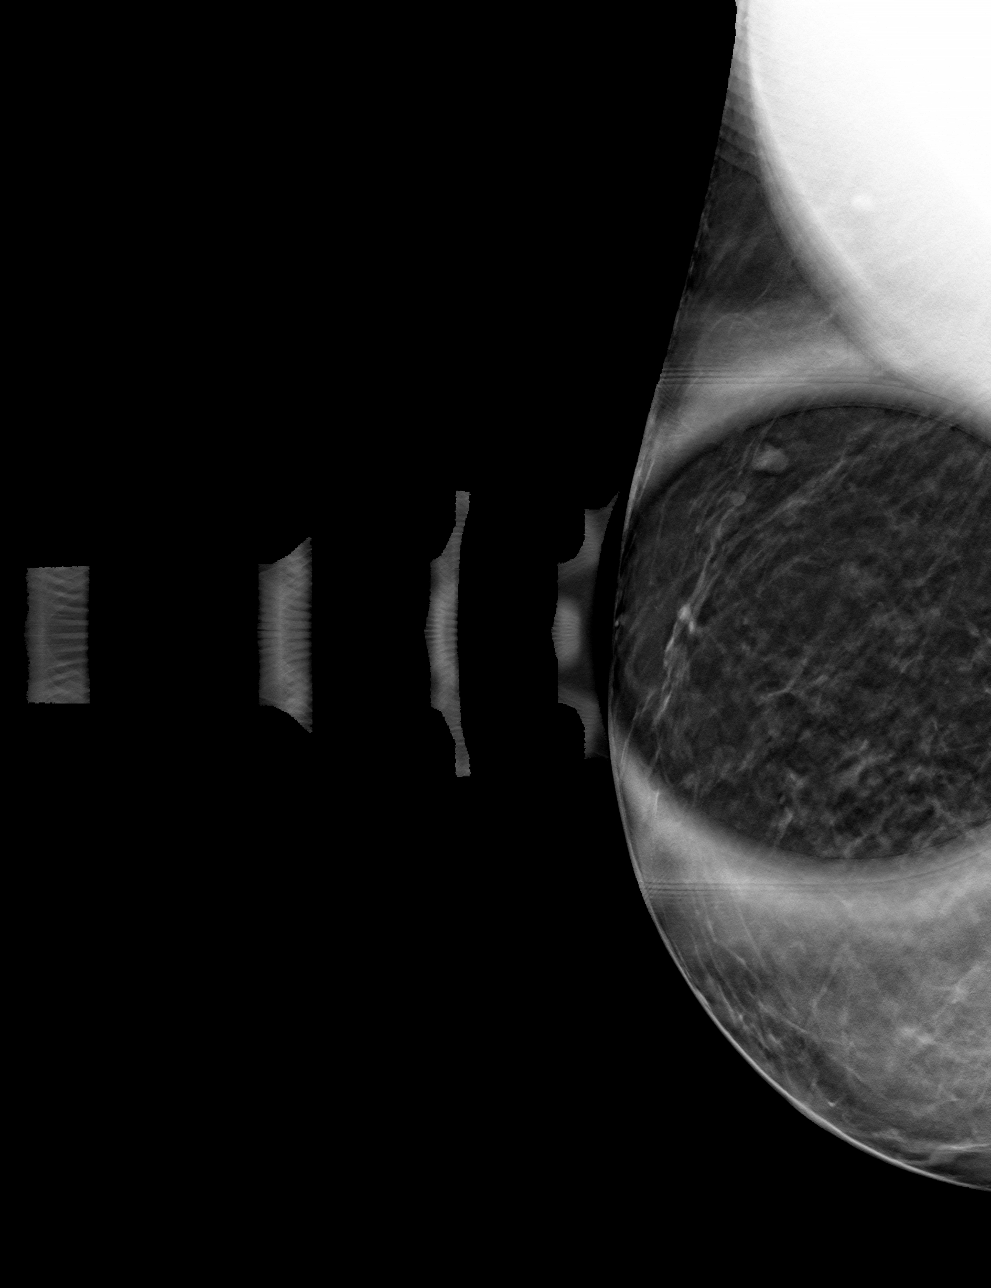

[4 of 12 positions shown; findings below may reference images not displayed]

ACR Breast Density Category b: There are scattered areas of
fibroglandular density.
FINDINGS: Persistent oval circumscribed mass with suggestion of fatty notch
within outer aspect of the right breast posterior depth.

Mammographic images were processed with CAD.

Targeted ultrasound is performed, showing a 5 mm intramammary node
right breast 9 o'clock position 8 cm from nipple.

Additionally right breast 9 o'clock position 12 cm from nipple there
is a 5 mm intramammary node.
IMPRESSION: No mammographic evidence for malignancy.

Right breast intramammary lymph node.

RECOMMENDATION:
Screening mammogram in one year.(Code:[id number])

I have discussed the findings and recommendations with the patient.
If applicable, a reminder letter will be sent to the patient
regarding the next appointment.

BI-RADS CATEGORY  2: Benign.

## 2022-03-03 ENCOUNTER — Other Ambulatory Visit: Payer: Self-pay

## 2022-03-15 ENCOUNTER — Ambulatory Visit: Payer: 59 | Admitting: Nurse Practitioner

## 2022-03-15 ENCOUNTER — Encounter: Payer: Self-pay | Admitting: Nurse Practitioner

## 2022-03-15 VITALS — BP 146/90 | HR 106 | Temp 98.6°F | Ht 61.0 in | Wt 161.6 lb

## 2022-03-15 DIAGNOSIS — E669 Obesity, unspecified: Secondary | ICD-10-CM

## 2022-03-15 DIAGNOSIS — I10 Essential (primary) hypertension: Secondary | ICD-10-CM

## 2022-03-15 DIAGNOSIS — Z23 Encounter for immunization: Secondary | ICD-10-CM

## 2022-03-15 DIAGNOSIS — E1169 Type 2 diabetes mellitus with other specified complication: Secondary | ICD-10-CM | POA: Diagnosis not present

## 2022-03-15 DIAGNOSIS — E782 Mixed hyperlipidemia: Secondary | ICD-10-CM

## 2022-03-15 DIAGNOSIS — E6609 Other obesity due to excess calories: Secondary | ICD-10-CM | POA: Diagnosis not present

## 2022-03-15 DIAGNOSIS — Z79899 Other long term (current) drug therapy: Secondary | ICD-10-CM

## 2022-03-15 DIAGNOSIS — Z6830 Body mass index (BMI) 30.0-30.9, adult: Secondary | ICD-10-CM

## 2022-03-15 MED ORDER — OLMESARTAN MEDOXOMIL 20 MG PO TABS: 20.0000 mg | ORAL_TABLET | Freq: Every day | ORAL | 2 refills | Status: DC

## 2022-03-15 MED ORDER — ATORVASTATIN CALCIUM 10 MG PO TABS: ORAL_TABLET | ORAL | 2 refills | Status: DC

## 2022-03-15 NOTE — Patient Instructions (Addendum)
Hypertension, Adult Hypertension is another name for high blood pressure. High blood pressure forces your heart to work harder to pump blood. This can cause problems over time. There are two numbers in a blood pressure reading. There is a top number (systolic) over a bottom number (diastolic). It is best to have a blood pressure that is below 120/80. What are the causes? The cause of this condition is not known. Some other conditions can lead to high blood pressure. What increases the risk? Some lifestyle factors can make you more likely to develop high blood pressure: Smoking. Not getting enough exercise or physical activity. Being overweight. Having too much fat, sugar, calories, or salt (sodium) in your diet. Drinking too much alcohol. Other risk factors include: Having any of these conditions: Heart disease. Diabetes. High cholesterol. Kidney disease. Obstructive sleep apnea. Having a family history of high blood pressure and high cholesterol. Age. The risk increases with age. Stress. What are the signs or symptoms? High blood pressure may not cause symptoms. Very high blood pressure (hypertensive crisis) may cause: Headache. Fast or uneven heartbeats (palpitations). Shortness of breath. Nosebleed. Vomiting or feeling like you may vomit (nauseous). Changes in how you see. Very bad chest pain. Feeling dizzy. Seizures. How is this treated? This condition is treated by making healthy lifestyle changes, such as: Eating healthy foods. Exercising more. Drinking less alcohol. Your doctor may prescribe medicine if lifestyle changes do not help enough and if: Your top number is above 130. Your bottom number is above 80. Your personal target blood pressure may vary. Follow these instructions at home: Eating and drinking  If told, follow the DASH eating plan. To follow this plan: Fill one half of your plate at each meal with fruits and vegetables. Fill one fourth of your plate  at each meal with whole grains. Whole grains include whole-wheat pasta, brown rice, and whole-grain bread. Eat or drink low-fat dairy products, such as skim milk or low-fat yogurt. Fill one fourth of your plate at each meal with low-fat (lean) proteins. Low-fat proteins include fish, chicken without skin, eggs, beans, and tofu. Avoid fatty meat, cured and processed meat, or chicken with skin. Avoid pre-made or processed food. Limit the amount of salt in your diet to less than 1,500 mg each day. Do not drink alcohol if: Your doctor tells you not to drink. You are pregnant, may be pregnant, or are planning to become pregnant. If you drink alcohol: Limit how much you have to: 0-1 drink a day for women. 0-2 drinks a day for men. Know how much alcohol is in your drink. In the [place] one drink equals one 12 oz bottle of beer (355 mL), one 5 oz glass of wine (148 mL), or one 1 oz glass of hard liquor (44 mL). Lifestyle  Work with your doctor to stay at a healthy weight or to lose weight. Ask your doctor what the best weight is for you. Get at least 30 minutes of exercise that causes your heart to beat faster (aerobic exercise) most days of the week. This may include walking, swimming, or biking. Get at least 30 minutes of exercise that strengthens your muscles (resistance exercise) at least 3 days a week. This may include lifting weights or doing Pilates. Do not smoke or use any products that contain nicotine or tobacco. If you need help quitting, ask your doctor. Check your blood pressure at home as told by your doctor. Keep all follow-up visits. Medicines Take over-the-counter and prescription medicines  only as told by your doctor. Follow directions carefully. Do not skip doses of blood pressure medicine. The medicine does not work as well if you skip doses. Skipping doses also puts you at risk for problems. Ask your doctor about side effects or reactions to medicines that you should watch  for. Contact a doctor if: You think you are having a reaction to the medicine you are taking. You have headaches that keep coming back. You feel dizzy. You have swelling in your ankles. You have trouble with your vision. Get help right away if: You get a very bad headache. You start to feel mixed up (confused). You feel weak or numb. You feel faint. You have very bad pain in your: Chest. Belly (abdomen). You vomit more than once. You have trouble breathing. These symptoms may be an emergency. Get help right away. Call 911. Do not wait to see if the symptoms will go away. Do not drive yourself to the hospital. Summary Hypertension is another name for high blood pressure. High blood pressure forces your heart to work harder to pump blood. For most people, a normal blood pressure is less than 120/80. Making healthy choices can help lower blood pressure. If your blood pressure does not get lower with healthy choices, you may need to take medicine. This information is not intended to replace advice given to you by your health care provider. Make sure you discuss any questions you have with your health care provider. Document Revised: 01/07/2021 Document Reviewed: 01/07/2021 Elsevier Patient Education  2023 Elsevier Inc. Type 2 Diabetes Mellitus, Diagnosis, Adult Type 2 diabetes (type 2 diabetes mellitus) is a long-term (chronic) disease. It may happen when there is one or both of these problems: The pancreas does not make enough insulin. The body does not react in a normal way to insulin that it makes. Insulin lets sugars go into cells in your body. If you have type 2 diabetes, sugars cannot get into your cells. Sugars build up in the blood. This causes high blood sugar. What are the causes? The exact cause of this condition is not known. What increases the risk? Having type 2 diabetes in your family. Being overweight or very overweight. Not being active. Your body not reacting in  a normal way to the insulin it makes. Having higher than normal blood sugar over time. Having a type of diabetes when you were pregnant. Having a condition that causes small fluid-filled sacs on your ovaries. What are the signs or symptoms? At first, you may have no symptoms. You will get symptoms slowly. They may include: More thirst than normal. More hunger than normal. Needing to pee more than normal. Losing weight without trying. Feeling tired. Feeling weak. Seeing things blurry. Dark patches on your skin. How is this treated? This condition may be treated by a diabetes expert. You may need to: Follow an eating plan made by a food expert (dietitian). Get regular exercise. Find ways to deal with stress. Check blood sugar as often as told. Take medicines. Your doctor will set treatment goals for you. Your blood sugar should be at these levels: Before meals: 80-130 mg/dL (2.2-2.9 mmol/L). After meals: below 180 mg/dL (10 mmol/L). Over the last 2-3 months: less than 7%. Follow these instructions at home: Medicines Take your diabetes medicines or insulin every day. Take medicines as told to help you prevent other problems caused by this condition. You may need: Aspirin. Medicine to lower cholesterol. Medicine to control blood pressure. Questions to ask  your doctor Should I meet with a diabetes educator? What medicines do I need, and when should I take them? What will I need to treat my condition at home? When should I check my blood sugar? Where can I find a support group? Who can I call if I have questions? When is my next doctor visit? General instructions Take over-the-counter and prescription medicines only as told by your doctor. Keep all follow-up visits. Where to find more information For help and guidance and more information about diabetes, please go to: American Diabetes Association (ADA): www.diabetes.org American Association of Diabetes Care and Education  Specialists (ADCES): www.diabeteseducator.org International Diabetes Federation (IDF): DCOnly.dk Contact a doctor if: Your blood sugar is at or above 240 mg/dL (01.0 mmol/L) for 2 days in a row. You have been sick for 2 days or more, and you are not getting better. You have had a fever for 2 days or more, and you are not getting better. You have any of these problems for more than 6 hours: You cannot eat or drink. You feel like you may vomit. You vomit. You have watery poop (diarrhea). Get help right away if: Your blood sugar is lower than 54 mg/dL (3 mmol/L). You feel mixed up (confused). You have trouble thinking clearly. You have trouble breathing. You have medium or large ketone levels in your pee. These symptoms may be an emergency. Get help right away. Call your local emergency services (911 in the [place] Do not wait to see if the symptoms will go away. Do not drive yourself to the hospital. Summary Type 2 diabetes is a long-term disease. Your pancreas may not make enough insulin, or your body may not react in a normal way to insulin that it makes. This condition is treated with an eating plan, lifestyle changes, and medicines. Your doctor will set treatment goals for you. These will help you keep your blood sugar in a healthy range. Keep all follow-up visits. This information is not intended to replace advice given to you by your health care provider. Make sure you discuss any questions you have with your health care provider. Document Revised: 06/15/2020 Document Reviewed: 06/15/2020 Elsevier Patient Education  2023 Elsevier Inc.   Influenza (Flu) Vaccine (Inactivated or Recombinant): What You Need to Know 1. Why get vaccinated? Influenza vaccine can prevent influenza (flu). Flu is a contagious disease that spreads around the [place] every year, usually between October and May. Anyone can get the flu, but it is more dangerous for some people. Infants and young  children, people 36 years and older, pregnant people, and people with certain health conditions or a weakened immune system are at greatest risk of flu complications. Pneumonia, bronchitis, sinus infections, and ear infections are examples of flu-related complications. If you have a medical condition, such as heart disease, cancer, or diabetes, flu can make it worse. Flu can cause fever and chills, sore throat, muscle aches, fatigue, cough, headache, and runny or stuffy nose. Some people may have vomiting and diarrhea, though this is more common in children than adults. In an average year, thousands of people in the [place] die from flu, and many more are hospitalized. Flu vaccine prevents millions of illnesses and flu-related visits to the doctor each year. 2. Influenza vaccines CDC recommends everyone 6 months and older get vaccinated every flu season. Children 6 months through 16 years of age may need 2 doses during a single flu season. Everyone else needs only 1 dose each flu season.  It takes about 2 weeks for protection to develop after vaccination. There are many flu viruses, and they are always changing. Each year a new flu vaccine is made to protect against the influenza viruses believed to be likely to cause disease in the upcoming flu season. Even when the vaccine doesn't exactly match these viruses, it may still provide some protection. Influenza vaccine does not cause flu. Influenza vaccine may be given at the same time as other vaccines. 3. Talk with your health care provider Tell your vaccination provider if the person getting the vaccine: Has had an allergic reaction after a previous dose of influenza vaccine, or has any severe, life-threatening allergies Has ever had Guillain-Barr Syndrome (also called "GBS") In some cases, your health care provider may decide to postpone influenza vaccination until a future visit. Influenza vaccine can be administered at any time during  pregnancy. People who are or will be pregnant during influenza season should receive inactivated influenza vaccine. People with minor illnesses, such as a cold, may be vaccinated. People who are moderately or severely ill should usually wait until they recover before getting influenza vaccine. Your health care provider can give you more information. 4. Risks of a vaccine reaction Soreness, redness, and swelling where the shot is given, fever, muscle aches, and headache can happen after influenza vaccination. There may be a very small increased risk of Guillain-Barr Syndrome (GBS) after inactivated influenza vaccine (the flu shot). Young children who get the flu shot along with pneumococcal vaccine (PCV13) and/or DTaP vaccine at the same time might be slightly more likely to have a seizure caused by fever. Tell your health care provider if a child who is getting flu vaccine has ever had a seizure. People sometimes faint after medical procedures, including vaccination. Tell your provider if you feel dizzy or have vision changes or ringing in the ears. As with any medicine, there is a very remote chance of a vaccine causing a severe allergic reaction, other serious injury, or death. 5. What if there is a serious problem? An allergic reaction could occur after the vaccinated person leaves the clinic. If you see signs of a severe allergic reaction (hives, swelling of the face and throat, difficulty breathing, a fast heartbeat, dizziness, or weakness), call 9-1-1 and get the person to the nearest hospital. For other signs that concern you, call your health care provider. Adverse reactions should be reported to the Vaccine Adverse Event Reporting System (VAERS). Your health care provider will usually file this report, or you can do it yourself. Visit the VAERS website at www.vaers.SamedayNews.es or call 817-226-4919. VAERS is only for reporting reactions, and VAERS staff members do not give medical advice. 6. The  National Vaccine Injury Compensation Program The [employer] Vaccine Injury Compensation Program (VICP) is a federal program that was created to compensate people who may have been injured by certain vaccines. Claims regarding alleged injury or death due to vaccination have a time limit for filing, which may be as short as two years. Visit the VICP website at GoldCloset.com.ee or call 623-400-9005 to learn about the program and about filing a claim. 7. How can I learn more? Ask your health care provider. Call your local or state health department. Visit the website of the Food and Drug Administration (FDA) for vaccine package inserts and additional information at TraderRating.uy. Contact the Centers for Disease Control and Prevention (CDC): Call 810-551-6317 (1-800-CDC-INFO) or Visit CDC's website at https://gibson.com/. Source: CDC Vaccine Information Statement Inactivated Influenza Vaccine (11/08/2019) This  same material is available at FootballExhibition.com.br for no charge. This information is not intended to replace advice given to you by your health care provider. Make sure you discuss any questions you have with your health care provider. Document Revised: 02/16/2021 Document Reviewed: 12/10/2020 Elsevier Patient Education  2023 Elsevier Inc.  Tdap (Tetanus, Diphtheria, Pertussis) Vaccine: What You Need to Know 1. Why get vaccinated? Tdap vaccine can prevent tetanus, diphtheria, and pertussis. Diphtheria and pertussis spread from person to person. Tetanus enters the body through cuts or wounds. TETANUS (T) causes painful stiffening of the muscles. Tetanus can lead to serious health problems, including being unable to open the mouth, having trouble swallowing and breathing, or death. DIPHTHERIA (D) can lead to difficulty breathing, heart failure, paralysis, or death. PERTUSSIS (aP), also known as "whooping cough," can cause uncontrollable, violent coughing that  makes it hard to breathe, eat, or drink. Pertussis can be extremely serious especially in babies and young children, causing pneumonia, convulsions, brain damage, or death. In teens and adults, it can cause weight loss, loss of bladder control, passing out, and rib fractures from severe coughing. 2. Tdap vaccine Tdap is only for children 7 years and older, adolescents, and adults.  Adolescents should receive a single dose of Tdap, preferably at age 28 or 12 years. Pregnant people should get a dose of Tdap during every pregnancy, preferably during the early part of the third trimester, to help protect the newborn from pertussis. Infants are most at risk for severe, life-threatening complications from pertussis. Adults who have never received Tdap should get a dose of Tdap. Also, adults should receive a booster dose of either Tdap or Td (a different vaccine that protects against tetanus and diphtheria but not pertussis) every 10 years, or after 5 years in the case of a severe or dirty wound or burn. Tdap may be given at the same time as other vaccines. 3. Talk with your health care provider Tell your vaccine provider if the person getting the vaccine: Has had an allergic reaction after a previous dose of any vaccine that protects against tetanus, diphtheria, or pertussis, or has any severe, life-threatening allergies Has had a coma, decreased level of consciousness, or prolonged seizures within 7 days after a previous dose of any pertussis vaccine (DTP, DTaP, or Tdap) Has seizures or another nervous system problem Has ever had Guillain-Barr Syndrome (also called "GBS") Has had severe pain or swelling after a previous dose of any vaccine that protects against tetanus or diphtheria In some cases, your health care provider may decide to postpone Tdap vaccination until a future visit. People with minor illnesses, such as a cold, may be vaccinated. People who are moderately or severely ill should usually  wait until they recover before getting Tdap vaccine.  Your health care provider can give you more information. 4. Risks of a vaccine reaction Pain, redness, or swelling where the shot was given, mild fever, headache, feeling tired, and nausea, vomiting, diarrhea, or stomachache sometimes happen after Tdap vaccination. People sometimes faint after medical procedures, including vaccination. Tell your provider if you feel dizzy or have vision changes or ringing in the ears.  As with any medicine, there is a very remote chance of a vaccine causing a severe allergic reaction, other serious injury, or death. 5. What if there is a serious problem? An allergic reaction could occur after the vaccinated person leaves the clinic. If you see signs of a severe allergic reaction (hives, swelling of the face and throat, difficulty  breathing, a fast heartbeat, dizziness, or weakness), call 9-1-1 and get the person to the nearest hospital. For other signs that concern you, call your health care provider.  Adverse reactions should be reported to the Vaccine Adverse Event Reporting System (VAERS). Your health care provider will usually file this report, or you can do it yourself. Visit the VAERS website at www.vaers.LAgents.nohhs.gov or call (478) 256-97861-(551)459-6691. VAERS is only for reporting reactions, and VAERS staff members do not give medical advice. 6. The National Vaccine Injury Compensation Program The [employer] Vaccine Injury Compensation Program (VICP) is a federal program that was created to compensate people who may have been injured by certain vaccines. Claims regarding alleged injury or death due to vaccination have a time limit for filing, which may be as short as two years. Visit the VICP website at SpiritualWord.atwww.hrsa.gov/vaccinecompensation or call 351-164-13901-506-663-9173 to learn about the program and about filing a claim. 7. How can I learn more? Ask your health care provider. Call your local or state health department. Visit the website  of the Food and Drug Administration (FDA) for vaccine package inserts and additional information at FinderList.nowww.fda.gov/vaccines-blood-biologics/vaccines. Contact the Centers for Disease Control and Prevention (CDC): Call 90730381351-636-708-6262 (1-800-CDC-INFO) or Visit CDC's website at PicCapture.uywww.cdc.gov/vaccines. Source: CDC Vaccine Information Statement Tdap (Tetanus, Diphtheria, Pertussis) Vaccine (11/08/2019) This same material is available at FootballExhibition.com.brwww.cdc.gov for no charge. This information is not intended to replace advice given to you by your health care provider. Make sure you discuss any questions you have with your health care provider. Document Revised: 02/16/2021 Document Reviewed: 12/21/2020 Elsevier Patient Education  2023 Elsevier Inc.   - take your atorvastatin at bedtime and stay well hydrated with at least 64 oz.  - take your olmesartan in morning and amlodipine at night

## 2022-03-15 NOTE — Progress Notes (Signed)
I,Madeline Blankenship,acting as a Education administrator for Madeline Brine, FNP.,have documented all relevant documentation on the behalf of Madeline Brine, FNP,as directed by  Madeline Brine, FNP while in the presence of Madeline Blankenship, [place].    Subjective:     Patient ID: Madeline Blankenship , female    DOB: [birth year] , 56 y.o.   MRN: [id number]   Chief Complaint  Patient presents with   Hypertension   Diabetes    HPI  Patient presents today for BP & DM f/u.   She reports going to urgent care last week, they performed covid & strep test. At [facility], both test came back negative.    She states she still has throat sensitivity, especially when she swallows. Along with a dry cough. She drinks water which does help.  She was given cough syrup after visit to the ed, she did pick up from pharmacy but she admits not using. Will get a dry cough every blue moon.   She also reports a fall she had at work last Thursday.  She also states she did not do CT scan scheduled for 11/30/203 at [facility] imaging , $500 copay which she was not willing to pay.   Hypertension This is a chronic problem. The current episode started more than 1 year ago. The problem has been gradually worsening since onset. The problem is uncontrolled. Pertinent negatives include no anxiety, blurred vision, chest pain or palpitations. There are no associated agents to hypertension. Risk factors for coronary artery disease include sedentary lifestyle. Past treatments include calcium channel blockers and ACE inhibitors. There is no history of angina. There is no history of chronic renal disease.  Diabetes Pertinent negatives for diabetes include no blurred vision and no chest pain.     Past Medical History:  Diagnosis Date   Diabetes mellitus without complication ([place])    Hyperlipidemia    Hypertension      Family History  Problem Relation Age of Onset   Diabetes Mother    Hypertension Mother    Cancer Mother    Diabetes Father     Hypertension Father    Cancer Father    Stroke Father    Vision loss Paternal Aunt    Breast cancer Paternal Aunt    Breast cancer Paternal Aunt      Current Outpatient Medications:    Accu-Chek Softclix Lancets lancets, USE UP TO 4 TIMES DAILY AS DIRECTED, Disp: 100 each, Rfl: 0   amLODipine (NORVASC) 10 MG tablet, Take 1 tablet (10 mg total) by mouth daily., Disp: 90 tablet, Rfl: 1   atorvastatin (LIPITOR) 10 MG tablet, Take 1 tablet by mouth at bedtime, Disp: 30 tablet, Rfl: 2   glucose blood (ACCU-CHEK GUIDE) test strip, Use as instructed, Disp: 100 each, Rfl: 0   olmesartan (BENICAR) 20 MG tablet, Take 1 tablet (20 mg total) by mouth daily. In morning, Disp: 30 tablet, Rfl: 2   ibuprofen (ADVIL) 800 MG tablet, Take 800 mg by mouth., Disp: , Rfl:    Allergies  Allergen Reactions   Codeine Itching     Review of Systems  Constitutional: Negative.   Eyes:  Negative for blurred vision.  Respiratory: Negative.    Cardiovascular: Negative.  Negative for chest pain and palpitations.  Neurological: Negative.   Psychiatric/Behavioral: Negative.       Today's Vitals   03/15/22 1459  BP: (!) 150/110  Pulse: (!) 106  Temp: 98.6 F (37 C)  SpO2: 98%  Weight: 161 lb  9.6 oz (73.3 kg)  Height: _0  (1.549 m)   Body mass index is 30.53 kg/m.  Wt Readings from Last 3 Encounters:  03/15/22 161 lb 9.6 oz (73.3 kg)  11/17/21 166 lb (75.3 kg)  09/26/19 191 lb 6.4 oz (86.8 kg)    Objective:  Physical Exam Vitals reviewed.  Constitutional:      General: She is not in acute distress.    Appearance: Normal appearance.  Cardiovascular:     Rate and Rhythm: Normal rate and regular rhythm.     Pulses: Normal pulses.     Heart sounds: Normal heart sounds. No murmur heard. Pulmonary:     Effort: Pulmonary effort is normal. No respiratory distress.     Breath sounds: Normal breath sounds. No wheezing.  Skin:    General: Skin is warm and dry.     Capillary Refill: Capillary refill  takes less than 2 seconds.  Neurological:     General: No focal deficit present.     Mental Status: She is alert and oriented to person, place, and time.     Cranial Nerves: No cranial nerve deficit.     Motor: No weakness.  Psychiatric:        Mood and Affect: Mood normal.        Behavior: Behavior normal.        Thought Content: Thought content normal.        Judgment: Judgment normal.         Assessment And Plan:     1. Uncontrolled hypertension Comments: Blood pressure remains elevated, will add olmesartan 20 mg daily in am. Return in 2 weeks for [place] blood pressure check - CMP14+EGFR - olmesartan (BENICAR) 20 MG tablet; Take 1 tablet (20 mg total) by mouth daily. In morning  Dispense: 30 tablet; Refill: 2  2. Diabetes mellitus type 2 in obese [facility]) Comments: HgbA1c was 5.8, continue to focus on diet control. Encouraged to lose another 10-15 lbs. - Hemoglobin A1c  3. Mixed hyperlipidemia Comments: Will start on low dose atorvastatin. - Lipid panel - atorvastatin (LIPITOR) 10 MG tablet; Take 1 tablet by mouth at bedtime  Dispense: 30 tablet; Refill: 2  4. Need for influenza vaccination Influenza vaccine administered Encouraged to take Tylenol as needed for fever or muscle aches. - Flu Vaccine QUAD High Dose(Fluad)  5. Need for Tdap vaccination Will give tetanus vaccine today while in office. Refer to order management. TDAP will be administered to adults 51-70 years old every 10 years. - Tdap vaccine greater than or equal to 7yo IM  6. Class 1 obesity due to excess calories with serious comorbidity and body mass index (BMI) of 30.0 to 30.9 in adult She is encouraged to strive for BMI less than 30 to decrease cardiac risk. Advised to aim for at least 150 minutes of exercise per week.  7. Other long term (current) drug therapy - CBC    Patient was given opportunity to ask questions. Patient verbalized understanding of the plan and was able to repeat key elements of the  plan. All questions were answered to their satisfaction.  Madeline Brine, FNP   I, Madeline Brine, FNP, have reviewed all documentation for this visit. The documentation on 03/15/22 for the exam, diagnosis, procedures, and orders are all accurate and complete.   IF YOU HAVE BEEN REFERRED TO A SPECIALIST, IT MAY TAKE 1-2 WEEKS TO SCHEDULE/PROCESS THE REFERRAL. IF YOU HAVE NOT HEARD FROM US/SPECIALIST IN TWO WEEKS, PLEASE GIVE [place] A CALL  AT [phone].   THE PATIENT IS ENCOURAGED TO PRACTICE SOCIAL DISTANCING DUE TO THE COVID-19 PANDEMIC.

## 2022-03-16 LAB — CBC
Hematocrit: 41.6 % (ref 34.0–46.6)
Hemoglobin: 13.8 g/dL (ref 11.1–15.9)
MCH: 29.4 pg (ref 26.6–33.0)
MCHC: 33.2 g/dL (ref 31.5–35.7)
MCV: 89 fL (ref 79–97)
Platelets: 351 10*3/uL (ref 150–450)
RBC: 4.7 x10E6/uL (ref 3.77–5.28)
RDW: 12.4 % (ref 11.7–15.4)
WBC: 8.7 10*3/uL (ref 3.4–10.8)

## 2022-03-16 LAB — CMP14+EGFR
ALT: 8 IU/L (ref 0–32)
AST: 11 IU/L (ref 0–40)
Albumin/Globulin Ratio: 1.6 (ref 1.2–2.2)
Albumin: 4.2 g/dL (ref 3.8–4.9)
Alkaline Phosphatase: 84 IU/L (ref 44–121)
BUN/Creatinine Ratio: 15 (ref 9–23)
BUN: 15 mg/dL (ref 6–24)
Bilirubin Total: <0.2 mg/dL (ref 0.0–1.2)
CO2: 23 mmol/L (ref 20–29)
Calcium: 9.5 mg/dL (ref 8.7–10.2)
Chloride: 108 mmol/L — ABNORMAL HIGH (ref 96–106)
Creatinine, Ser: 1.01 mg/dL — ABNORMAL HIGH (ref 0.57–1.00)
Globulin, Total: 2.7 g/dL (ref 1.5–4.5)
Glucose: 96 mg/dL (ref 70–99)
Potassium: 4.3 mmol/L (ref 3.5–5.2)
Sodium: 144 mmol/L (ref 134–144)
Total Protein: 6.9 g/dL (ref 6.0–8.5)
eGFR: 67 mL/min/{1.73_m2} (ref >59)

## 2022-03-16 LAB — LIPID PANEL
Chol/HDL Ratio: 4.9 ratio — ABNORMAL HIGH (ref 0.0–4.4)
Cholesterol, Total: 214 mg/dL — ABNORMAL HIGH (ref 100–199)
HDL: 44 mg/dL (ref >39)
LDL Chol Calc (NIH): 149 mg/dL — ABNORMAL HIGH (ref 0–99)
Triglycerides: 116 mg/dL (ref 0–149)
VLDL Cholesterol Cal: 21 mg/dL (ref 5–40)

## 2022-03-16 LAB — HEMOGLOBIN A1C
Est. average glucose Bld gHb Est-mCnc: 117 mg/dL
Hgb A1c MFr Bld: 5.7 % — ABNORMAL HIGH (ref 4.8–5.6)

## 2022-03-30 MED ORDER — ATORVASTATIN CALCIUM 20 MG PO TABS: ORAL_TABLET | ORAL | 1 refills | Status: DC

## 2022-03-31 ENCOUNTER — Ambulatory Visit: Payer: 59

## 2022-04-12 ENCOUNTER — Ambulatory Visit: Payer: 59

## 2022-04-19 ENCOUNTER — Encounter: Payer: 59 | Admitting: Nurse Practitioner

## 2022-05-20 ENCOUNTER — Telehealth: Payer: Self-pay

## 2022-05-20 NOTE — Telephone Encounter (Addendum)
Received letter from [place] that patient may not be adherent with medication.  Spoke with pharmacy, patient picked up Amlodipine 04/06/22  #30 03/03/22  #30 11/17/21  #30   LVM for patient to return call

## 2022-09-14 ENCOUNTER — Ambulatory Visit: Payer: Self-pay | Admitting: Nurse Practitioner

## 2022-12-14 ENCOUNTER — Other Ambulatory Visit: Payer: Self-pay | Admitting: Nurse Practitioner

## 2022-12-14 DIAGNOSIS — I10 Essential (primary) hypertension: Secondary | ICD-10-CM

## 2022-12-27 ENCOUNTER — Emergency Department (HOSPITAL_COMMUNITY): Payer: Self-pay

## 2022-12-27 ENCOUNTER — Ambulatory Visit (HOSPITAL_COMMUNITY)
Admission: EM | Admit: 2022-12-27 | Discharge: 2022-12-27 | Disposition: A | Discharge status: Home / Self Care | Payer: Self-pay | Attending: Family Medicine | Admitting: Family Medicine

## 2022-12-27 ENCOUNTER — Observation Stay (HOSPITAL_COMMUNITY): Payer: Self-pay

## 2022-12-27 ENCOUNTER — Encounter (HOSPITAL_COMMUNITY): Payer: Self-pay

## 2022-12-27 ENCOUNTER — Other Ambulatory Visit: Payer: Self-pay

## 2022-12-27 ENCOUNTER — Observation Stay (HOSPITAL_COMMUNITY)
Admission: EM | Admit: 2022-12-27 | Discharge: 2022-12-28 | Disposition: A | Discharge status: Home / Self Care | Payer: Self-pay | Attending: Internal Medicine | Admitting: Internal Medicine

## 2022-12-27 DIAGNOSIS — Z7984 Long term (current) use of oral hypoglycemic drugs: Secondary | ICD-10-CM | POA: Insufficient documentation

## 2022-12-27 DIAGNOSIS — E1149 Type 2 diabetes mellitus with other diabetic neurological complication: Secondary | ICD-10-CM

## 2022-12-27 DIAGNOSIS — R202 Paresthesia of skin: Secondary | ICD-10-CM

## 2022-12-27 DIAGNOSIS — R42 Dizziness and giddiness: Secondary | ICD-10-CM

## 2022-12-27 DIAGNOSIS — E119 Type 2 diabetes mellitus without complications: Secondary | ICD-10-CM | POA: Insufficient documentation

## 2022-12-27 DIAGNOSIS — I639 Cerebral infarction, unspecified: Principal | ICD-10-CM

## 2022-12-27 DIAGNOSIS — I6350 Cerebral infarction due to unspecified occlusion or stenosis of unspecified cerebral artery: Secondary | ICD-10-CM

## 2022-12-27 DIAGNOSIS — I10 Essential (primary) hypertension: Secondary | ICD-10-CM

## 2022-12-27 DIAGNOSIS — Z79899 Other long term (current) drug therapy: Secondary | ICD-10-CM | POA: Insufficient documentation

## 2022-12-27 DIAGNOSIS — E782 Mixed hyperlipidemia: Secondary | ICD-10-CM

## 2022-12-27 DIAGNOSIS — I6381 Other cerebral infarction due to occlusion or stenosis of small artery: Secondary | ICD-10-CM

## 2022-12-27 DIAGNOSIS — Z122 Encounter for screening for malignant neoplasm of respiratory organs: Secondary | ICD-10-CM

## 2022-12-27 DIAGNOSIS — F17210 Nicotine dependence, cigarettes, uncomplicated: Secondary | ICD-10-CM | POA: Insufficient documentation

## 2022-12-27 LAB — COMPREHENSIVE METABOLIC PANEL
ALT: 12 U/L (ref 0–44)
AST: 12 U/L — ABNORMAL LOW (ref 15–41)
Albumin: 3.5 g/dL (ref 3.5–5.0)
Alkaline Phosphatase: 72 U/L (ref 38–126)
Anion gap: 8 (ref 5–15)
BUN: 9 mg/dL (ref 6–20)
CO2: 21 mmol/L — ABNORMAL LOW (ref 22–32)
Calcium: 8.6 mg/dL — ABNORMAL LOW (ref 8.9–10.3)
Chloride: 110 mmol/L (ref 98–111)
Creatinine, Ser: 0.83 mg/dL (ref 0.44–1.00)
GFR, Estimated: >60 mL/min (ref >60)
Glucose, Bld: 93 mg/dL (ref 70–99)
Potassium: 3.8 mmol/L (ref 3.5–5.1)
Sodium: 139 mmol/L (ref 135–145)
Total Bilirubin: 0.4 mg/dL (ref 0.3–1.2)
Total Protein: 6.7 g/dL (ref 6.5–8.1)

## 2022-12-27 LAB — CBC
HCT: 41.6 % (ref 36.0–46.0)
Hemoglobin: 13.5 g/dL (ref 12.0–15.0)
MCH: 29.6 pg (ref 26.0–34.0)
MCHC: 32.5 g/dL (ref 30.0–36.0)
MCV: 91.2 fL (ref 80.0–100.0)
Platelets: 334 10*3/uL (ref 150–400)
RBC: 4.56 MIL/uL (ref 3.87–5.11)
RDW: 13.1 % (ref 11.5–15.5)
WBC: 7.6 10*3/uL (ref 4.0–10.5)
nRBC: 0 % (ref 0.0–0.2)

## 2022-12-27 LAB — DIFFERENTIAL
Abs Immature Granulocytes: 0.02 10*3/uL (ref 0.00–0.07)
Basophils Absolute: 0 10*3/uL (ref 0.0–0.1)
Basophils Relative: 1 %
Eosinophils Absolute: 0 10*3/uL (ref 0.0–0.5)
Eosinophils Relative: 0 %
Immature Granulocytes: 0 %
Lymphocytes Relative: 43 %
Lymphs Abs: 3.2 10*3/uL (ref 0.7–4.0)
Monocytes Absolute: 0.4 10*3/uL (ref 0.1–1.0)
Monocytes Relative: 5 %
Neutro Abs: 3.9 10*3/uL (ref 1.7–7.7)
Neutrophils Relative %: 51 %

## 2022-12-27 LAB — CBG MONITORING, ED: Glucose-Capillary: 75 mg/dL (ref 70–99)

## 2022-12-27 LAB — ETHANOL: Alcohol, Ethyl (B): <10 mg/dL (ref <10)

## 2022-12-27 LAB — PROTIME-INR
INR: 1 (ref 0.8–1.2)
Prothrombin Time: 13 seconds (ref 11.4–15.2)

## 2022-12-27 LAB — APTT: aPTT: 29 seconds (ref 24–36)

## 2022-12-27 LAB — POCT FASTING CBG KUC MANUAL ENTRY: POCT Glucose (KUC): 112 mg/dL — AB (ref 70–99)

## 2022-12-27 MED ORDER — IOHEXOL 350 MG/ML SOLN
75.0000 mL | Freq: Once | INTRAVENOUS | Status: AC | PRN
  Administered 2022-12-27: 75 mL via INTRAVENOUS

## 2022-12-27 MED ORDER — CALCIUM CARBONATE 1250 (500 CA) MG PO TABS
1.0000 | ORAL_TABLET | Freq: Once | ORAL | Status: AC
  Administered 2022-12-27: 1250 mg via ORAL
  Filled 2022-12-27: qty 1

## 2022-12-27 MED ORDER — ACETAMINOPHEN 650 MG RE SUPP: 650.0000 mg | RECTAL | Status: DC | PRN

## 2022-12-27 MED ORDER — CLOPIDOGREL BISULFATE 75 MG PO TABS
75.0000 mg | ORAL_TABLET | Freq: Every day | ORAL | Status: DC
  Administered 2022-12-28: 75 mg via ORAL
  Filled 2022-12-27: qty 1

## 2022-12-27 MED ORDER — ASPIRIN 81 MG PO TBEC
81.0000 mg | DELAYED_RELEASE_TABLET | Freq: Every day | ORAL | Status: DC
  Administered 2022-12-28: 81 mg via ORAL
  Filled 2022-12-27: qty 1

## 2022-12-27 MED ORDER — LORAZEPAM 1 MG PO TABS
0.5000 mg | ORAL_TABLET | Freq: Once | ORAL | Status: AC
  Administered 2022-12-27: 0.5 mg via ORAL
  Filled 2022-12-27: qty 1

## 2022-12-27 MED ORDER — ACETAMINOPHEN 160 MG/5ML PO SOLN: 650.0000 mg | ORAL | Status: DC | PRN

## 2022-12-27 MED ORDER — ACETAMINOPHEN 325 MG PO TABS: 650.0000 mg | ORAL_TABLET | ORAL | Status: DC | PRN

## 2022-12-27 MED ORDER — SODIUM CHLORIDE 0.9% FLUSH: 3.0000 mL | Freq: Once | INTRAVENOUS | Status: DC

## 2022-12-27 MED ORDER — ENOXAPARIN SODIUM 40 MG/0.4ML IJ SOSY
40.0000 mg | PREFILLED_SYRINGE | INTRAMUSCULAR | Status: DC
  Administered 2022-12-27: 40 mg via SUBCUTANEOUS
  Filled 2022-12-27: qty 0.4

## 2022-12-27 MED ORDER — SENNOSIDES-DOCUSATE SODIUM 8.6-50 MG PO TABS: 1.0000 | ORAL_TABLET | Freq: Every evening | ORAL | Status: DC | PRN

## 2022-12-27 MED ORDER — ATORVASTATIN CALCIUM 10 MG PO TABS: 20.0000 mg | ORAL_TABLET | Freq: Every day | ORAL | Status: DC

## 2022-12-27 MED ORDER — ATORVASTATIN CALCIUM 40 MG PO TABS
40.0000 mg | ORAL_TABLET | Freq: Every day | ORAL | Status: DC
  Administered 2022-12-28: 40 mg via ORAL
  Filled 2022-12-27: qty 1

## 2022-12-27 MED ORDER — STROKE: EARLY STAGES OF RECOVERY BOOK
Freq: Once | Status: AC
  Filled 2022-12-27: qty 1

## 2022-12-27 NOTE — ED Notes (Signed)
Spoke to [place] from MRI, she informed me she will let us know when she will be ready to go to MRI.

## 2022-12-27 NOTE — Assessment & Plan Note (Signed)
-  resume atorvastatin and increase to high intensity 40mg

## 2022-12-27 NOTE — ED Notes (Signed)
Pt ambulated to the bathroom.

## 2022-12-27 NOTE — ED Notes (Signed)
Pt went to CT, CT will bring pt to room 44

## 2022-12-27 NOTE — Assessment & Plan Note (Addendum)
-   Last hemoglobin A1c of 5.7 in 03/2022. Has been off metformin for the past month. -Repeat hemoglobin A1c

## 2022-12-27 NOTE — ED Provider Notes (Signed)
[place] EMERGENCY DEPARTMENT AT [facility] Provider Note   CSN: [id number] Arrival date & time: 12/27/22  4098     History  Chief Complaint  Patient presents with   Numbness    Madeline Blankenship is a 57 y.o. female with PMHx DM, HLD, HTN who presents to ED concerned for AMS, aphasia, and right sided weakness episode last night around 9PM. Patient stating that she was sitting in her bed when episode happened which caused her to slide onto the floor. Patient also with tingling in hands and feet that initially started last night, resolved, and is now occurring again in right hand/foot. Patient states that she felt a little dizzy yesterday, but was able to go celebrate a friend's birthday for dinner.   During AMS episode last night, patient denies LOC, seizures, head trauma. Patient denying headache, vision changes, blood thinners. Denies fever, chest pain, dyspnea, cough, nausea, vomiting, diarrhea.   HPI     Home Medications Prior to Admission medications   Medication Sig Start Date End Date Taking? Authorizing Provider  Accu-Chek Softclix Lancets lancets USE UP TO 4 TIMES DAILY AS DIRECTED 11/18/21   Arnette Felts, FNP  amLODipine (NORVASC) 10 MG tablet Take 10 mg by mouth daily. 11/17/21   [provider]  atorvastatin (LIPITOR) 20 MG tablet Take 1 tablet by mouth at bedtime 03/30/22   Arnette Felts, FNP  glucose blood (ACCU-CHEK GUIDE) test strip Use as instructed 11/18/21   Arnette Felts, FNP  ibuprofen (ADVIL) 800 MG tablet Take 800 mg by mouth. 11/29/21   [provider]  metFORMIN (GLUCOPHAGE) 850 MG tablet Take 850 mg by mouth daily with breakfast. 03/02/22   [provider]  olmesartan (BENICAR) 20 MG tablet Take 1 tablet (20 mg total) by mouth daily. In morning 03/15/22   Arnette Felts, FNP      Allergies    Codeine and Doxycycline    Review of Systems   Review of Systems  Neurological:        AMS    Physical Exam Updated  Vital Signs BP 134/82 (BP Location: Right Arm)   Pulse 82   Temp 98.5 F (36.9 C) (Oral)   Resp 18   SpO2 100%  Physical Exam Vitals and nursing note reviewed.  Constitutional:      General: She is not in acute distress.    Appearance: She is not ill-appearing or toxic-appearing.  HENT:     Head: Normocephalic and atraumatic.     Mouth/Throat:     Mouth: Mucous membranes are moist.  Eyes:     General: No scleral icterus.       Right eye: No discharge.        Left eye: No discharge.     Conjunctiva/sclera: Conjunctivae normal.  Cardiovascular:     Rate and Rhythm: Normal rate and regular rhythm.     Pulses: Normal pulses.     Heart sounds: Normal heart sounds. No murmur heard. Pulmonary:     Effort: Pulmonary effort is normal. No respiratory distress.     Breath sounds: Normal breath sounds. No wheezing, rhonchi or rales.  Abdominal:     General: Abdomen is flat. Bowel sounds are normal. There is no distension.     Palpations: Abdomen is soft. There is no mass.     Tenderness: There is no abdominal tenderness.  Musculoskeletal:     Right lower leg: No edema.     Left lower leg: No edema.  Skin:  General: Skin is warm and dry.     Findings: No rash.  Neurological:     General: No focal deficit present.     Mental Status: She is alert and oriented to person, place, and time. Mental status is at baseline.     Comments: GCS 15. Speech is goal oriented. No deficits appreciated to CN III-XII; symmetric eyebrow raise, no facial drooping, tongue midline. Patient has equal grip strength bilaterally with 5/5 strength against resistance in all major muscle groups bilaterally. Sensation to light touch intact. Patient moves extremities without ataxia. Patient ambulatory with steady gait.   Psychiatric:        Mood and Affect: Mood normal.     ED Results / Procedures / Treatments   Labs (all labs ordered are listed, but only abnormal results are displayed) Labs Reviewed   COMPREHENSIVE METABOLIC PANEL - Abnormal; Notable for the following components:      Result Value   CO2 21 (*)    Calcium 8.6 (*)    AST 12 (*)    All other components within normal limits  PROTIME-INR  APTT  CBC  DIFFERENTIAL  ETHANOL    EKG EKG Interpretation Date/Time:  Tuesday December 27 2022 10:03:56 EDT Ventricular Rate:  88 PR Interval:  120 QRS Duration:  96 QT Interval:  364 QTC Calculation: 440 R Axis:   32  Text Interpretation: Normal sinus rhythm Borderline ECG No previous ECGs available Confirmed by Alvester Chou [phone]) on 12/27/2022 11:52:02 AM  Radiology No results found.  Procedures Procedures    Medications Ordered in ED Medications  sodium chloride flush (NS) 0.9 % injection 3 mL (has no administration in time range)    ED Course/ Medical Decision Making/ A&P                                 Medical Decision Making Amount and/or Complexity of Data Reviewed Labs: ordered. Radiology: ordered.  Risk Prescription drug management.   This patient presents to the ED for concern of AMS and right hand/foot tingling, this involves an extensive number of treatment options, and is a complaint that carries with it a high risk of complications and morbidity.  The differential diagnosis includes CVA, ICH, intracranial mass, critical dehydration, heptatic dysfunction, uremia, hypercarbia, intoxication/withdrawal, sepsis/infection.   Co morbidities that complicate the patient evaluation  DM, HTN, HLD   Additional history obtained:  Patient seen at UC earlier today who referred her to ED   Lab Tests:  I Ordered, and personally interpreted labs.  The pertinent results include: -CBC: No concern for anemia or leukocytosis -PTINR/APTT: within normal limts -CMP: no concern for electrolyte abnormality; no concern for kidney/liver damage -ETOH: negative   Imaging Studies ordered:  I ordered imaging studies including  -CT head/MR brain: to  assess for process contributing to patient's symptoms I independently visualized and interpreted imaging  I agree with the radiologist interpretation   Cardiac Monitoring: / EKG:  The patient was maintained on a cardiac monitor.  I personally viewed and interpreted the cardiac monitored which showed an underlying rhythm of: sinus rhythm without acute ST changes or arrhythmias   Problem List / ED Course / Critical interventions / Medication management  Patient presented for episode of hand/foot tingling, right sided weakness and AMS last night around 9PM. AMS and right sided weakness has since resolved. The tingling had also resolved, but patient now stating that the tingling in  her hand and toes has recurred. Patient went to UC who referred her to ED. Physical and neuroexam unremarkable.  Patient afebrile with stable vitals. CBC without leukocytosis or anemia.  CMP reassuring.  EtOH negative. CT head without acute abnormalities - will proceed with MR brain. I have reviewed the patients home medicines and have made adjustments as needed Patient was given return precautions. Patient stable for discharge at this time. Patient verbalized understanding of plan.   DDx: These were considered less likely due to history of present illness and physical exam findings - critical dehydration/heptatic dysfunction/Uremia: CMP without concern - Hypercarbia/Intoxication/withdrawal: patient history inconsistent with this diagnosis  - Sepsis: patient afebrile with stable vitals   Social Determinants of Health:  none   3:19PM Care of Madeline Blankenship transferred to [employer] at the end of my shift as the patient will require reassessment once labs/imaging have resulted. Patient presentation, ED course, and plan of care discussed with review of all pertinent labs and imaging. Please see his/her note for further details regarding further ED course and disposition. Plan at time of handoff is  reassess patient after MRI. If patient is still experiencing right hand/foot tingling, I would recommend consulting with Neurology. If all of patient's symptoms have resolved, I would discuss outpatient follow up. This may be altered or completely changed at the discretion of the oncoming team pending results of further workup.           Final Clinical Impression(s) / ED Diagnoses Final diagnoses:  None    Rx / DC Orders ED Discharge Orders     None         Dorthy Cooler, [place] 12/27/22 1519    Terald Sleeper, MD 12/27/22 [phone]

## 2022-12-27 NOTE — ED Triage Notes (Addendum)
Patient here today with c/o tingling in right hand fingers and right toes since last night. Patient states that night after coming home from dinner she sat on the bed and felt dizziness and blurred vision. She got up from the bed and sat on the chair and then on the floor. Her daughter states that she had slurred speech, confusion, right arm/fingers and leg/toes tingling. H/o HTN.

## 2022-12-27 NOTE — Consult Note (Signed)
NEUROLOGY CONSULTATION NOTE   Date of service: December 27, 2022 Patient Name: Madeline Blankenship MRN:  [id number] DOB:  Aug 19, 1965 Reason for consult: "pontine stroke" Requesting Provider: Benjiman Core, MD _ _ _   _ __   _ __ _ _  __ __   _ __   __ _  History of Present Illness  Madeline Blankenship is a 57 y.o. female with PMH significant for DM2, HTN, HLD, everyday smoker who presents with episode of R sided weakness and numbness.  Last night around 2100, was in the bed and had sudden onset R sided weakness from lower face down into her R arm and R leg. She tried to get up but unable to stand up as her R leg was weak. This went on for 20 mins before it improved. She still has numbness in R fingers but otherwise, symptoms are gone.  She had MRI brain without contrast which demonstrated a small punctate infarct in the right pons as well as the left pons.  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  She denies any prior history of strokes, she endorses family history of stroke on her biological father side.  She reports she has diabetes and has been compliant with her metformin.  She does not check her blood pressure at home but has been taking her antihypertensives.  She endorses history of high cholesterol but has been taking her medication regularly.  She reports that she is a chronic everyday smoker and goes through a pack in a day and a half.  She has been smoking for the last 25+ years.  She has tried to quit smoking the past and the only thing that seems to have worked for her with nicotine patches.  She had side effects of both Wellbutrin as well as Chantix.  LKW: 2100 on 12/26/2022. mRS: 0 tNKASE: Not offered and is outside the window to mild to treat. Thrombectomy: Not offered due to low suspicion for LVO. NIHSS components Score: Comment  1a Level of Conscious 0[x]  1[]  2[]  3[]      1b LOC Questions 0[x]  1[]  2[]       1c LOC Commands 0[x]  1[]  2[]       2 Best Gaze  0[x]  1[]  2[]       3 Visual 0[x]  1[]  2[]  3[]      4 Facial Palsy 0[x]  1[]  2[]  3[]      5a Motor Arm - left 0[x]  1[]  2[]  3[]  4[]  UN[]    5b Motor Arm - Right 0[x]  1[]  2[]  3[]  4[]  UN[]    6a Motor Leg - Left 0[x]  1[]  2[]  3[]  4[]  UN[]    6b Motor Leg - Right 0[x]  1[]  2[]  3[]  4[]  UN[]    7 Limb Ataxia 0[x]  1[]  2[]  3[]  UN[]     8 Sensory 0[x]  1[]  2[]  UN[]      9 Best Language 0[x]  1[]  2[]  3[]      10 Dysarthria 0[x]  1[]  2[]  UN[]      11 Extinct. and Inattention 0[x]  1[]  2[]       TOTAL: 0         ROS   Constitutional Denies weight loss, fever and chills.   HEENT Denies changes in vision and hearing.   Respiratory Denies SOB and cough.   CV Denies palpitations and CP   GI Denies abdominal pain, nausea, vomiting and diarrhea.   GU Denies dysuria and urinary frequency.   MSK Denies myalgia and joint pain.   Skin Denies rash and pruritus.   Neurological Denies  headache and syncope.   Psychiatric Denies recent changes in mood. Denies anxiety and depression.    Past History   Past Medical History:  Diagnosis Date   Diabetes mellitus without complication (HCC)    Hyperlipidemia    Hypertension    Past Surgical History:  Procedure Laterality Date   ABDOMINAL HYSTERECTOMY     BREAST EXCISIONAL BIOPSY Right    partial historectomy     Family History  Problem Relation Age of Onset   Diabetes Mother    Hypertension Mother    Cancer Mother    Diabetes Father    Hypertension Father    Cancer Father    Stroke Father    Vision loss Paternal Aunt    Breast cancer Paternal Aunt    Breast cancer Paternal Aunt    Social History   Socioeconomic History   Marital status: Single    Spouse name: Not on file   Number of children: Not on file   Years of education: Not on file   Highest education level: Not on file  Occupational History   Not on file  Tobacco Use   Smoking status: Every Day    Current packs/day: 1.00    Average packs/day: 1 pack/day for 30.0 years (30.0 ttl pk-yrs)     Types: Cigarettes   Smokeless tobacco: Never  Substance and Sexual Activity   Alcohol use: Yes   Drug use: Yes    Types: Marijuana   Sexual activity: Not on file  Other Topics Concern   Not on file  Social History Narrative   Not on file   Social Determinants of Health   Financial Resource Strain: Not on file  Food Insecurity: Not on file  Transportation Needs: Not on file  Physical Activity: Not on file  Stress: Not on file  Social Connections: Not on file   Allergies  Allergen Reactions   Codeine Itching   Doxycycline     Medications  (Not in a hospital admission)    Vitals   Vitals:   12/27/22 1715 12/27/22 1730 12/27/22 1745 12/27/22 1747  BP: (!) 151/91 (!) 158/100 (!) 161/92   Pulse: 66 76 73   Resp: 15 12 20    Temp:      TempSrc:      SpO2: 100% 100% 100%   Weight:    74.8 kg  Height:    5\' 5"  (1.651 m)     Body mass index is 27.46 kg/m.  Physical Exam   General: Laying comfortably in bed; in no acute distress.  HENT: Normal oropharynx and mucosa. Normal external appearance of ears and nose.  Neck: Supple, no pain or tenderness  CV: No JVD. No peripheral edema.  Pulmonary: Symmetric Chest rise. Normal respiratory effort.  Abdomen: Soft to touch, non-tender.  Ext: No cyanosis, edema, or deformity  Skin: No rash. Normal palpation of skin.   Musculoskeletal: Normal digits and nails by inspection. No clubbing.   Neurologic Examination  Mental status/Cognition: Alert, oriented to self, place, month and year, good attention.  Speech/language: Fluent, comprehension intact, object naming intact, repetition intact.  Cranial nerves:   CN II Pupils equal and reactive to light, no VF deficits    CN III,IV,VI EOM intact, no gaze preference or deviation, no nystagmus    CN V normal sensation in V1, V2, and V3 segments bilaterally    CN VII no asymmetry, no nasolabial fold flattening    CN VIII normal hearing to speech    CN  IX & X normal palatal  elevation, no uvular deviation    CN XI 5/5 head turn and 5/5 shoulder shrug bilaterally    CN XII midline tongue protrusion    Motor:  Muscle bulk: normal, tone normal, pronator drift none tremor none Mvmt Root Nerve  Muscle Right Left Comments  SA C5/6 Ax Deltoid 5 5   EF C5/6 Mc Biceps 5 5   EE C6/7/8 Rad Triceps 5 5   WF C6/7 Med FCR     WE C7/8 PIN ECU     F Ab C8/T1 U ADM/FDI 5 5   HF L1/2/3 Fem Illopsoas 4+ 5   KE L2/3/4 Fem Quad 5 5   DF L4/5 D Peron Tib Ant 5 5   PF S1/2 Tibial Grc/Sol 5 5    Sensation:  Light touch Intact throughout   Pin prick    Temperature    Vibration   Proprioception    Coordination/Complex Motor:  - Finger to Nose intact bilaterally - Heel to shin intact bilaterally - Rapid alternating movement are normal - Gait: Deferred for patient safety. Labs   CBC:  Recent Labs  Lab 12/27/22 1007  WBC 7.6  NEUTROABS 3.9  HGB 13.5  HCT 41.6  MCV 91.2  PLT 334    Basic Metabolic Panel:  Lab Results  Component Value Date   NA 139 12/27/2022   K 3.8 12/27/2022   CO2 21 (L) 12/27/2022   GLUCOSE 93 12/27/2022   BUN 9 12/27/2022   CREATININE 0.83 12/27/2022   CALCIUM 8.6 (L) 12/27/2022   GFRNONAA >60 12/27/2022   GFRAA 88 09/26/2019   Lipid Panel:  Lab Results  Component Value Date   LDLCALC 149 (H) 03/15/2022   HgbA1c:  Lab Results  Component Value Date   HGBA1C 5.7 (H) 03/15/2022   Urine Drug Screen: No results found for: "LABOPIA", "COCAINSCRNUR", "LABBENZ", "AMPHETMU", "THCU", "LABBARB"  Alcohol Level     Component Value Date/Time   ETH <10 12/27/2022 1007   INR  Lab Results  Component Value Date   INR 1.0 12/27/2022   APTT  Lab Results  Component Value Date   APTT 29 12/27/2022     CT Head without contrast(Personally reviewed): CTH was negative for a large hypodensity concerning for a large territory infarct or hyperdensity concerning for an ICH  CT angio Head and Neck with contrast: pending  MRI  Brain(Personally reviewed): Pontine stroke on left as well as right  Impression   Jaxon Gilles is a 57 y.o. female with PMH significant for DM2, HTN, HLD, everyday smoker who presents with episode of R sided weakness and numbness.  Symptoms significantly improved after 20 minutes with mild residual right lower extremity weakness and numbness in the right fingers. She was found to have a acute left pontine stroke along with an acute right pontine stroke.  The etiology of the stroke is likely small vessel disease. Primary Diagnosis:  Other cerebral infarction due to occlusion of stenosis of small artery.  Secondary Diagnosis: Essential (primary) hypertension and Type 2 diabetes mellitus w/o complications  Recommendations  - Frequent Neuro checks per stroke unit protocol - Recommend Vascular imaging with CT angio head and neck. - Recommend obtaining TTE - Recommend obtaining Lipid panel with LDL - Please start statin if LDL > 70 - Recommend HbA1c to evaluate for diabetes and how well it is controlled. - Antithrombotic -aspirin 81 mg daily, Plavix 70 mg daily for 21 days, followed by aspirin 81 mg  daily alone. - Recommend DVT ppx - SBP goal -aim for gradual normotension.- Recommend Telemetry monitoring for arrythmia - Recommend bedside swallow screen prior to PO intake. - Stroke education booklet - Recommend PT/OT/SLP consult - counseled her on the importance of quitting smoking to reduce risk of strokes in the future.  ______________________________________________________________________   Thank you for the opportunity to take part in the care of this patient. If you have any further questions, please contact the neurology consultation attending.  Signed,  Erick Blinks [facility] _ _ _   _ __   _ __ _ _  __ __   _ __   __ _

## 2022-12-27 NOTE — H&P (Addendum)
History and Physical    Patient: Madeline Blankenship QIH:[id number] DOB: 12-Oct-1965 DOA: 12/27/2022 DOS: the patient was seen and examined on 12/27/2022 PCP: Arnette Felts, FNP  Patient coming from: Home  Chief Complaint:  Chief Complaint  Patient presents with   Numbness   HPI: Madeline Blankenship is a 57 y.o. female with medical history significant of HTN, T2DM, HLD,obesity with dizziness and right-sided paresthesia.  Pt was sitting on her bed and talking with family last night around 9pm. She suddenly felt dizzy and slid down onto the ground. Complained of numbness to her right fingers and toes. Family noted slurred speech. They then monitor her BP throughout the night and SPB was elevated but never greater than 200. She woke up today and continued to have numbness of her hands and feet so family brought her to ED. She recently ran out of her metformin, statin and one of her BP pills for a month. Has been taking her amlodipine and an aspirin a day. She uses tobacco and smokes a pack every 2 days. Social alcohol use. Uses marijuana.   In the ED, she was afebrile BP elevated to 166/91 on room air.  CBC without leukocytosis or anemia.  BMP notable for mild hypocalcemia of 8.6.  CT head was negative for any acute intracranial process.  MRI brain however demonstrated small acute pontine infarct.  Small chronic right cerebellar infarct.  Review of Systems: As mentioned in the history of present illness. All other systems reviewed and are negative. Past Medical History:  Diagnosis Date   Diabetes mellitus without complication (HCC)    Hyperlipidemia    Hypertension    Past Surgical History:  Procedure Laterality Date   ABDOMINAL HYSTERECTOMY     BREAST EXCISIONAL BIOPSY Right    partial historectomy     Social History:  reports that she has been smoking cigarettes. She has a 30 pack-year smoking history. She has never used smokeless tobacco. She reports current alcohol use.  She reports current drug use. Drug: Marijuana.  Allergies  Allergen Reactions   Codeine Itching   Doxycycline     Family History  Problem Relation Age of Onset   Diabetes Mother    Hypertension Mother    Cancer Mother    Diabetes Father    Hypertension Father    Cancer Father    Stroke Father    Vision loss Paternal Aunt    Breast cancer Paternal Aunt    Breast cancer Paternal Aunt     Prior to Admission medications   Medication Sig Start Date End Date Taking? Authorizing Provider  ibuprofen (ADVIL) 800 MG tablet Take 800 mg by mouth. 11/29/21  Yes [provider]  Accu-Chek Softclix Lancets lancets USE UP TO 4 TIMES DAILY AS DIRECTED 11/18/21   Arnette Felts, FNP  amLODipine (NORVASC) 10 MG tablet Take 10 mg by mouth daily. Patient not taking: Reported on 12/27/2022 11/17/21   [provider]  atorvastatin (LIPITOR) 20 MG tablet Take 1 tablet by mouth at bedtime Patient not taking: Reported on 12/27/2022 03/30/22   Arnette Felts, FNP  glucose blood (ACCU-CHEK GUIDE) test strip Use as instructed 11/18/21   Arnette Felts, FNP  metFORMIN (GLUCOPHAGE) 850 MG tablet Take 850 mg by mouth daily with breakfast. Patient not taking: Reported on 12/27/2022 03/02/22   [provider]  olmesartan (BENICAR) 20 MG tablet Take 1 tablet (20 mg total) by mouth daily. In morning Patient not taking: Reported on 12/27/2022 03/15/22   Christell Constant,  Lolita Cram, FNP    Physical Exam: Vitals:   12/27/22 1747 12/27/22 2027 12/27/22 2030 12/27/22 2045  BP:  (!) 151/74 (!) 144/72 133/65  Pulse:  66 66 65  Resp:  (!) 21 (!) 21 20  Temp:  98.1 F (36.7 C)    TempSrc:  Oral    SpO2:  100% 100% 100%  Weight: 74.8 kg     Height: 5\' 5"  (1.651 m)      Constitutional: NAD, calm, comfortable, well appearing middle age female laying in bed Eyes: PERRL, lids and conjunctivae normal ENMT: Mucous membranes are moist.  Neck: normal, supple Respiratory: clear to auscultation bilaterally, no  wheezing, no crackles. Normal respiratory effort. No accessory muscle use.  Cardiovascular: Regular rate and rhythm, no murmurs / rubs / gallops. No extremity edema. 2+ pedal pulses. No carotid bruits.  Abdomen: no tenderness, soft Musculoskeletal: no clubbing / cyanosis. No joint deformity upper and lower extremities. Normal muscle tone.  Skin: no rashes, lesions, ulcers. No induration Neurologic: CN 2-12 grossly intact.Strength 5/5 in all 4. Decrease sensation to the palmar tips of all fingers on right hand.  Psychiatric: Normal judgment and insight. Alert and oriented x 3. Normal mood.   Data Reviewed:  See HPI  Assessment and Plan: * Left pontine stroke (HCC) Right pontine stroke -5 mm acute infarct in the left dorsal pons and 2 mm acute infarct in the right central pons demonstrated on MRI brain -pt has been off her antihypertensive, cholesterol and diabetes medication for the past month. Also uses tobacco daily.  - obtain CTA head and neck - obtain echocardiogram  - continue daily aspirin and resume atorvastatin -Obtain A1c and lipids -PT/OT -Frequent neuro checks and keep on telemetry -Allow for permissive hypertension with blood pressure treatment as needed only if systolic goes above 220  Type 2 diabetes mellitus (HCC) - Last hemoglobin A1c of 5.7 in 03/2022. Has been off metformin for the past month. -Repeat hemoglobin A1c  Mixed hyperlipidemia -resume atorvastatin and increase to high intensity 40mg   Essential hypertension -allowing for permissive HTN -on amlodipine and omelsartan at home      Advance Care Planning: Full  Consults: neurology  Family Communication: daughter and son at bedside  Severity of Illness: The appropriate patient status for this patient is OBSERVATION. Observation status is judged to be reasonable and necessary in order to provide the required intensity of service to ensure the patient's safety. The patient's presenting symptoms,  physical exam findings, and initial radiographic and laboratory data in the context of their medical condition is felt to place them at decreased risk for further clinical deterioration. Furthermore, it is anticipated that the patient will be medically stable for discharge from the hospital within 2 midnights of admission.   Author: Anselm Jungling, DO 12/27/2022 9:22 PM  For on call review [email or web address].

## 2022-12-27 NOTE — Assessment & Plan Note (Addendum)
Right pontine stroke -5 mm acute infarct in the left dorsal pons and 2 mm acute infarct in the right central pons demonstrated on MRI brain -pt has been off her antihypertensive, cholesterol and diabetes medication for the past month. Also uses tobacco daily.  - obtain CTA head and neck - obtain echocardiogram  - continue daily aspirin and Plavix for 21 days. Then aspirin monotherapy. - resume atorvastatin -Obtain A1c and lipids -PT/OT -Frequent neuro checks and keep on telemetry -Allow for permissive hypertension with blood pressure treatment as needed only if systolic goes above 220

## 2022-12-27 NOTE — ED Notes (Signed)
Pt went to MRI via transport

## 2022-12-27 NOTE — ED Provider Notes (Signed)
[facility]    CSN: [id number] Arrival date & time: 12/27/22  1324      History   Chief Complaint Chief Complaint  Patient presents with   Dizziness    HPI Madeline Blankenship is a 57 y.o. female.    Dizziness Here for feeling dizzy and off yesterday.  This began yesterday during the day when she was at work.  It got better, and then she celebrated a birthday dinner with her family.  Last evening about 12 hours ago she was sitting on her bed talking to her daughter.  She then proceeded to slide off the bed onto the floor and was moving about on the floor and did not not respond to questions normally and was unconscious during that time.  She states that she told her daughter during that time she had some numbness and tingling on her right hand and foot.  That is better now but she is still having some tingling on her right upper and lower extremities.  She is no longer taking medication for diabetes, as she had lost weight.  Her only medication that she takes daily is amlodipine for hypertension.  Blood pressure last evening was elevated with a  diastolic of 100  She states this morning she feels better and is not experiencing any dizziness at this time.  Past Medical History:  Diagnosis Date   Diabetes mellitus without complication (HCC)    Hyperlipidemia    Hypertension     Patient Active Problem List   Diagnosis Date Noted   Essential hypertension 06/24/2021   Mixed hyperlipidemia 06/24/2021   Type 2 diabetes mellitus (HCC) 06/24/2021    Past Surgical History:  Procedure Laterality Date   ABDOMINAL HYSTERECTOMY     BREAST EXCISIONAL BIOPSY Right    partial historectomy      OB History   No obstetric history on file.      Home Medications    Prior to Admission medications   Medication Sig Start Date End Date Taking? Authorizing Provider  amLODipine (NORVASC) 10 MG tablet Take 10 mg by mouth daily. 11/17/21  Yes [provider]   metFORMIN (GLUCOPHAGE) 850 MG tablet Take 850 mg by mouth daily with breakfast. 03/02/22  Yes [provider]  Accu-Chek Softclix Lancets lancets USE UP TO 4 TIMES DAILY AS DIRECTED 11/18/21   Arnette Felts, FNP  atorvastatin (LIPITOR) 20 MG tablet Take 1 tablet by mouth at bedtime 03/30/22   Arnette Felts, FNP  glucose blood (ACCU-CHEK GUIDE) test strip Use as instructed 11/18/21   Arnette Felts, FNP  ibuprofen (ADVIL) 800 MG tablet Take 800 mg by mouth. 11/29/21   [provider]  olmesartan (BENICAR) 20 MG tablet Take 1 tablet (20 mg total) by mouth daily. In morning 03/15/22   Arnette Felts, FNP    Family History Family History  Problem Relation Age of Onset   Diabetes Mother    Hypertension Mother    Cancer Mother    Diabetes Father    Hypertension Father    Cancer Father    Stroke Father    Vision loss Paternal Aunt    Breast cancer Paternal Aunt    Breast cancer Paternal Aunt     Social History Social History   Tobacco Use   Smoking status: Every Day    Current packs/day: 1.00    Average packs/day: 1 pack/day for 30.0 years (30.0 ttl pk-yrs)    Types: Cigarettes   Smokeless tobacco: Never  Substance  Use Topics   Alcohol use: Yes   Drug use: Yes    Types: Marijuana     Allergies   Codeine and Doxycycline   Review of Systems Review of Systems  Neurological:  Positive for dizziness.     Physical Exam Triage Vital Signs ED Triage Vitals [12/27/22 0825]  Encounter Vitals Group     BP (!) 154/90     Systolic BP Percentile      Diastolic BP Percentile      Pulse Rate 92     Resp 16     Temp 98.3 F (36.8 C)     Temp Source Oral     SpO2 96 %     Weight      Height 5' 5.75" (1.67 m)     Head Circumference      Peak Flow      Pain Score 0     Pain Loc      Pain Education      Exclude from Growth Chart    No data found.  Updated Vital Signs BP (!) 154/90 (BP Location: Right Arm)   Pulse 92   Temp 98.3 F (36.8 C) (Oral)    Resp 16   Ht 5' 5.75" (1.67 m)   SpO2 96%   BMI 26.28 kg/m   Visual Acuity Right Eye Distance:   Left Eye Distance:   Bilateral Distance:    Right Eye Near:   Left Eye Near:    Bilateral Near:     Physical Exam Vitals reviewed.  Constitutional:      General: She is not in acute distress.    Appearance: She is not ill-appearing, toxic-appearing or diaphoretic.  HENT:     Mouth/Throat:     Mouth: Mucous membranes are moist.  Eyes:     Extraocular Movements: Extraocular movements intact.     Pupils: Pupils are equal, round, and reactive to light.  Cardiovascular:     Rate and Rhythm: Normal rate and regular rhythm.     Heart sounds: No murmur heard. Pulmonary:     Effort: Pulmonary effort is normal.     Breath sounds: Normal breath sounds.  Skin:    Coloration: Skin is not pale.  Neurological:     General: No focal deficit present.     Mental Status: She is alert and oriented to person, place, and time.  Psychiatric:        Behavior: Behavior normal.      UC Treatments / Results  Labs (all labs ordered are listed, but only abnormal results are displayed) Labs Reviewed  POCT FASTING CBG [facility] MANUAL ENTRY - Abnormal; Notable for the following components:      Result Value   POCT Glucose ([facility]) 112 (*)    All other components within normal limits    EKG   Radiology No results found.  Procedures Procedures (including critical care time)  Medications Ordered in UC Medications - No data to display  Initial Impression / Assessment and Plan / UC Course  I have reviewed the triage vital signs and the nursing notes.  Pertinent labs & imaging results that were available during my care of the patient were reviewed by me and considered in my medical decision making (see chart for details).        Fingerstick glucose is 112.  I have asked her to proceed to the emergency room for further evaluation and treatment which we cannot provide here in the urgent care  setting.  She is agreeable and will go by private car. Final Clinical Impressions(s) / UC Diagnoses   Final diagnoses:  Dizziness  Paresthesia     Discharge Instructions      Please go to the emergency room for further evaluation and treatment.     ED Prescriptions   None    PDMP not reviewed this encounter.   Zenia Resides, MD 12/27/22 [phone]

## 2022-12-27 NOTE — ED Provider Notes (Signed)
Handoff from [facility]. Intermittent dizziness yesterday, right sided weakness and aphasia at 9PM. No head trauma. R hand and foot tingling, aphasia and weakness which resolved, then this AM tingling in hand and foot happened again, has been persistent.   Physical Exam  BP (!) 145/86   Pulse 73   Temp 98.5 F (36.9 C) (Oral)   Resp (!) 22   SpO2 100%   Physical Exam Vitals and nursing note reviewed.  Constitutional:      General: She is not in acute distress.    Appearance: She is well-developed.  HENT:     Head: Normocephalic and atraumatic.  Eyes:     Conjunctiva/sclera: Conjunctivae normal.  Cardiovascular:     Rate and Rhythm: Normal rate and regular rhythm.     Heart sounds: No murmur heard. Pulmonary:     Effort: Pulmonary effort is normal. No respiratory distress.     Breath sounds: Normal breath sounds.  Abdominal:     Palpations: Abdomen is soft.     Tenderness: There is no abdominal tenderness.  Musculoskeletal:        General: No swelling.     Cervical back: Neck supple.  Skin:    General: Skin is warm and dry.     Capillary Refill: Capillary refill takes less than 2 seconds.  Neurological:     Mental Status: She is alert.     Comments: Decreased sensation to the right lower extremity and right upper extremity.  Strength intact 5 out of 5.  No facial droop noted.  Psychiatric:        Mood and Affect: Mood normal.     Procedures  Procedures  ED Course / MDM    Medical Decision Making Patient is a 57 year old female, handed off to me by [facility], patient waiting MRI.  Head CT was unremarkable.  She has had numbness since 9 PM yesterday.  MRI shows acute pontine infarcts.  I discussed this with the patient, she will require inpatient hospitalization for further evaluation.  I spoke to neurology on-call, Dr. Selina Cooley, and she states that they will see the patient.  Admit to hospitalist.  Spoke with Dr. Cyndia Bent, she accepts admission of the patient, for need for  further evaluation for etiology of CVA including echocardiogram.  And neurology consultation.  Amount and/or Complexity of Data Reviewed Labs: ordered. Radiology: ordered.  Risk Prescription drug management. Decision regarding hospitalization.       Pete Pelt, [place] 12/27/22 1940    Benjiman Core, MD 12/28/22 1439

## 2022-12-27 NOTE — ED Notes (Signed)
Patient is being discharged from the Urgent Care and sent to the Emergency Department via self . Per provider, patient is in need of higher level of care due to tingling in right hand finger and foot toes with dizziness last night. Patient is aware and verbalizes understanding of plan of care.  Vitals:   12/27/22 0825  BP: (!) 154/90  Pulse: 92  Resp: 16  Temp: 98.3 F (36.8 C)  SpO2: 96%

## 2022-12-27 NOTE — Assessment & Plan Note (Signed)
-  allowing for permissive HTN -on amlodipine and omelsartan at home

## 2022-12-27 NOTE — Discharge Instructions (Signed)
Please go to the emergency room for further evaluation and treatment.

## 2022-12-27 NOTE — ED Triage Notes (Signed)
Pt c.o episode of numbness, slurred speech and confusion lasting about 20 mins last night around 9pm. Pt still c.o numbness in her right hand and right foot. Denies any other symptoms. No weakness noted in triage.

## 2022-12-27 NOTE — ED Notes (Signed)
Pt back from MRI

## 2022-12-28 ENCOUNTER — Observation Stay (HOSPITAL_BASED_OUTPATIENT_CLINIC_OR_DEPARTMENT_OTHER): Payer: Self-pay

## 2022-12-28 DIAGNOSIS — I6389 Other cerebral infarction: Secondary | ICD-10-CM

## 2022-12-28 LAB — ECHOCARDIOGRAM COMPLETE
AR max vel: 2.13 cm2
AV Area VTI: 2.38 cm2
AV Area mean vel: 2.16 cm2
AV Mean grad: 4 mmHg
AV Peak grad: 7.1 mmHg
Ao pk vel: 1.33 m/s
Area-P 1/2: 3.37 cm2
Height: 65 in
S' Lateral: 2.8 cm
Weight: 2640 oz

## 2022-12-28 LAB — LIPID PANEL
Cholesterol: 211 mg/dL — ABNORMAL HIGH (ref 0–200)
HDL: 39 mg/dL — ABNORMAL LOW (ref >40)
LDL Cholesterol: 152 mg/dL — ABNORMAL HIGH (ref 0–99)
Total CHOL/HDL Ratio: 5.4 RATIO
Triglycerides: 98 mg/dL (ref <150)
VLDL: 20 mg/dL (ref 0–40)

## 2022-12-28 LAB — HEMOGLOBIN A1C
Hgb A1c MFr Bld: 5.5 % (ref 4.8–5.6)
Mean Plasma Glucose: 111.15 mg/dL

## 2022-12-28 LAB — HIV ANTIBODY (ROUTINE TESTING W REFLEX): HIV Screen 4th Generation wRfx: NONREACTIVE

## 2022-12-28 MED ORDER — ATORVASTATIN CALCIUM 80 MG PO TABS: 80.0000 mg | ORAL_TABLET | Freq: Every day | ORAL | Status: DC

## 2022-12-28 MED ORDER — CLOPIDOGREL BISULFATE 75 MG PO TABS: 75.0000 mg | ORAL_TABLET | Freq: Every day | ORAL | 0 refills | Status: DC

## 2022-12-28 MED ORDER — OLMESARTAN MEDOXOMIL 20 MG PO TABS: 20.0000 mg | ORAL_TABLET | Freq: Every day | ORAL | 0 refills | Status: DC

## 2022-12-28 MED ORDER — ASPIRIN 81 MG PO TBEC: 81.0000 mg | DELAYED_RELEASE_TABLET | Freq: Every day | ORAL | 0 refills | Status: DC

## 2022-12-28 MED ORDER — METFORMIN HCL 850 MG PO TABS: 850.0000 mg | ORAL_TABLET | Freq: Every day | ORAL | 0 refills | Status: DC

## 2022-12-28 MED ORDER — AMLODIPINE BESYLATE 10 MG PO TABS: 10.0000 mg | ORAL_TABLET | Freq: Every day | ORAL | 0 refills | Status: DC

## 2022-12-28 NOTE — Progress Notes (Signed)
*  PRELIMINARY RESULTS* Echocardiogram 2D Echocardiogram has been performed.  Madeline Blankenship 12/28/2022, 2:59 PM

## 2022-12-28 NOTE — Evaluation (Signed)
Physical Therapy Evaluation Patient Details Name: Madeline Blankenship MRN: [id number] DOB: 1965/07/26 Today's Date: 12/28/2022  History of Present Illness  Pt is 57 yo presenting to [facility] ED due to sudden onset dizziness, slurred speech, numbness of hands/feet and elevated BP. Pt found to have small acute pontine infarct and small chronic R cerebellar infarct with MRI. PMH: DM, Hyperlipidemia, HTN.  Clinical Impression  Pt is presenting very close to baseline level of functioning. She reports numbness/tingling in the 3rd and 4th digits of the R hand when she presses her fingers and bends them. Pt is independent with all functional mobility at this time. Due to pt current functional status, home set up and available assistance at home no recommended skilled physical therapy services at this time on discharge from acute care hospital setting. Pt will be discharge from acute care physical therapy services at this time; please re-consult if further needs arise.               Equipment Recommendations None recommended by PT     Functional Status Assessment Patient has not had a recent decline in their functional status     Precautions / Restrictions Precautions Precautions: Fall Restrictions Weight Bearing Restrictions: No      Mobility  Bed Mobility Overal bed mobility: Independent    Transfers Overall transfer level: Independent Equipment used: None      Ambulation/Gait Ambulation/Gait assistance: Independent Gait Distance (Feet): 200 Feet Assistive device: None Gait Pattern/deviations: WFL(Within Functional Limits)   Gait velocity interpretation: >2.62 ft/sec, indicative of community ambulatory   General Gait Details: Slightly externally rotated feet bil.  No LOB  Stairs Stairs:  (Pt demonstrates adequate strength with sit to stand and gait to perform steps per home set up)              Balance Overall balance assessment: No apparent balance deficits (not  formally assessed)           Pertinent Vitals/Pain Pain Assessment Pain Assessment: No/denies pain    Home Living Family/patient expects to be discharged to:: Private residence Living Arrangements: Children (daughter) Available Help at Discharge: Available PRN/intermittently Type of Home: House Home Access: Stairs to enter Entrance Stairs-Rails: None Entrance Stairs-Number of Steps: 2   Home Layout: One level Home Equipment: None      Prior Function Prior Level of Function : Independent/Modified Independent;Driving;Working/employed             Mobility Comments: Pt reports she was independent ADLs Comments: Pt reports that she is independent     Extremity/Trunk Assessment   Upper Extremity Assessment Upper Extremity Assessment: Overall WFL for tasks assessed    Lower Extremity Assessment Lower Extremity Assessment: Overall WFL for tasks assessed    Cervical / Trunk Assessment Cervical / Trunk Assessment: Normal  Communication   Communication Communication: No apparent difficulties Cueing Techniques: Verbal cues  Cognition Arousal: Alert Behavior During Therapy: WFL for tasks assessed/performed Overall Cognitive Status: Within Functional Limits for tasks assessed        General Comments General comments (skin integrity, edema, etc.): No noted skin issues. Pt has some difficutly with RAMPS and accuracy with coordination exercises with the hands with R>L        Assessment/Plan    PT Assessment Patient does not need any further PT services         PT Goals (Current goals can be found in the Care Plan section)  Acute Rehab PT Goals PT Goal Formulation: All assessment and education  complete, DC therapy     AM-PAC PT "6 Clicks" Mobility  Outcome Measure Help needed turning from your back to your side while in a flat bed without using bedrails?: None Help needed moving from lying on your back to sitting on the side of a flat bed without using  bedrails?: None Help needed moving to and from a bed to a chair (including a wheelchair)?: None Help needed standing up from a chair using your arms (e.g., wheelchair or bedside chair)?: None Help needed to walk in hospital room?: None Help needed climbing 3-5 steps with a railing? : None 6 Click Score: 24    End of Session   Activity Tolerance: Patient tolerated treatment well Patient left: in bed;with call bell/phone within reach;with family/visitor present Nurse Communication: Mobility status      Time: 8657-8469 PT Time Calculation (min) (ACUTE ONLY): 9 min   Charges:   PT Evaluation $PT Eval Low Complexity: 1 Low   PT General Charges $$ ACUTE PT VISIT: 1 Visit        Harrel Carina, DPT, CLT  Acute Rehabilitation Services Office: [phone] (Secure chat preferred)   Claudia Desanctis 12/28/2022, 9:06 AM

## 2022-12-28 NOTE — Discharge Summary (Signed)
Physician Discharge Summary  Madeline Blankenship NWG:[id number] DOB: July 31, 1965 DOA: 12/27/2022  PCP: Arnette Felts, FNP  Admit date: 12/27/2022 Discharge date: 12/28/2022  Admitted From: Home Disposition: Home  Recommendations for Outpatient Follow-up:  Follow up with PCP in 1-2 weeks Follow-up with neurology and 6-8 weeks for stroke follow-up Referral placed to pulmonology for lung cancer screening Continue DAPT with aspirin and Plavix x 3 months followed by aspirin alone Atorvastatin increased to 80 mg p.o. daily Continue to encourage tobacco cessation  Home Health: No needs identified by PT/OT Equipment/Devices: None  Discharge Condition: Stable CODE STATUS: Full code Diet recommendation: Heart healthy/consistent carbohydrate diet  History of present illness:  Madeline Blankenship is a 57 year old female with past medical history significant for HTN, type 2 diabetes mellitus, hyperlipidemia, obesity who presented to [facility] ED on 9/24 from home with complaints of dizziness and right-sided paresthesia.  Patient was sitting on her bed, talking with family around 9 PM instantly felt dizzy and slid down to the ground.  Complained of numbness to her right fingers and toes and family noted slurred speech.  Patient family monitor her BP throughout the night and SBP was elevated but never greater than 200.  As she woke up today, continued to have numbness in her hands and feet so apparently brought her to the ED for further evaluation.  She recently ran out of her metformin, statin and blood pressure medicines.  She does endorse using tobacco, smoking 1 pack every 2 days.  Social alcohol use and admits to occasional marijuana use.  In the ED, temperature 98.5 F, HR 90, RR 16, BP 166/91, SpO2 98% on room air.  WBC 7.6, hemoglobin 13.5, platelet count 334.  Sodium 139, potassium 3.8, chloride 110, CO2 21, glucose 93, BUN 9, creatinine 0.83.  AST 12, ALT 12, total bilirubin 0.4.  CT head without  contrast with no evidence of acute intracranial abnormality.  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  MR brain without contrast with small acute pontine infarcts, small chronic right cerebellar infarct.  Neurology was consulted.  TRH consulted for admission for further evaluation and management of acute CVA.   Hospital course:  Acute ischemic CVA Patient presenting to the ED with dizziness, right-sided paresthesias and slurred speech.  CT head without contrast with no evidence of acute intracranial abnormality.  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  MR brain without contrast with small acute pontine infarcts, small chronic right cerebellar infarct.  Neurology was consulted and followed during hospital course.  Hemoglobin A1c 5.5, LDL 152.  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  Will continue DAPT with aspirin and Plavix x 3 months followed by aspirin alone.  Atorvastatin was increased to 80 mg p.o. daily.  Tobacco  Schedule an appointment as soon as possible for a visit in 6 week(s).   Specialty: Neurology Contact information: [address] [place] [address] [phone]               Allergies  Allergen Reactions   Codeine Itching   Doxycycline     Consultations: Neurology   Procedures/Studies: ECHOCARDIOGRAM COMPLETE  Result Date: 12/28/2022    ECHOCARDIOGRAM REPORT   Patient Name:   [facility] Date of Exam: 12/28/2022 Medical Rec #:  [id number]             Height:       65.0 in Accession #:    [id number]            Weight:       165.0 lb Date of Birth:  May 06, 1965            BSA:          1.823 m Patient Age:    56 years              BP:            160/80 mmHg Patient Gender: F                     HR:           80 bpm. Exam Location:  Inpatient Procedure: 2D Echo, Cardiac Doppler and Color Doppler Indications:    Stroke  History:        Patient has no prior history of Echocardiogram examinations.                 Stroke; Risk Factors:Hypertension, Current Smoker, Dyslipidemia                 and Diabetes.  Sonographer:    Dondra Prader RVT RCS Referring Phys: [id number] CHING T TU IMPRESSIONS  1. Left ventricular ejection fraction, by estimation, is 55 to 60%. The left ventricle has normal function. The left ventricle has no regional wall motion abnormalities. Left ventricular diastolic parameters are indeterminate.  2. Right ventricular systolic function is normal. The right ventricular size is normal. Tricuspid regurgitation signal is inadequate for assessing PA pressure.  3. 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 No evidence of mitral stenosis. 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  4. The aortic valve is tricuspid. Aortic valve regurgitation is not visualized. Aortic valve sclerosis/calcification is present, without any evidence of aortic stenosis.  5. The inferior vena cava is dilated in size with <50% respiratory variability, suggesting right atrial pressure of 15 mmHg. Conclusion(s)/Recommendation(s): No intracardiac source of embolism detected on this transthoracic study. Consider a transesophageal echocardiogram to exclude cardiac source of embolism if clinically indicated. FINDINGS  Left Ventricle: Left ventricular ejection fraction, by estimation, is 55 to 60%. The left ventricle has normal function. The left ventricle has no regional wall motion abnormalities. The left ventricular internal cavity size was normal in size. There is  no left ventricular hypertrophy. Left ventricular diastolic parameters are indeterminate. Normal left  ventricular filling pressure. Right Ventricle: The right ventricular size is normal. No increase in right ventricular wall thickness. Right ventricular systolic function is normal. Tricuspid regurgitation signal is inadequate for assessing PA pressure. Left Atrium: Left atrial size was normal in size. Right Atrium: Right atrial size was normal in size. Pericardium: There is no evidence of pericardial  Physician Discharge Summary  Madeline Blankenship NWG:[id number] DOB: July 31, 1965 DOA: 12/27/2022  PCP: Arnette Felts, FNP  Admit date: 12/27/2022 Discharge date: 12/28/2022  Admitted From: Home Disposition: Home  Recommendations for Outpatient Follow-up:  Follow up with PCP in 1-2 weeks Follow-up with neurology and 6-8 weeks for stroke follow-up Referral placed to pulmonology for lung cancer screening Continue DAPT with aspirin and Plavix x 3 months followed by aspirin alone Atorvastatin increased to 80 mg p.o. daily Continue to encourage tobacco cessation  Home Health: No needs identified by PT/OT Equipment/Devices: None  Discharge Condition: Stable CODE STATUS: Full code Diet recommendation: Heart healthy/consistent carbohydrate diet  History of present illness:  Madeline Blankenship is a 57 year old female with past medical history significant for HTN, type 2 diabetes mellitus, hyperlipidemia, obesity who presented to [facility] ED on 9/24 from home with complaints of dizziness and right-sided paresthesia.  Patient was sitting on her bed, talking with family around 9 PM instantly felt dizzy and slid down to the ground.  Complained of numbness to her right fingers and toes and family noted slurred speech.  Patient family monitor her BP throughout the night and SBP was elevated but never greater than 200.  As she woke up today, continued to have numbness in her hands and feet so apparently brought her to the ED for further evaluation.  She recently ran out of her metformin, statin and blood pressure medicines.  She does endorse using tobacco, smoking 1 pack every 2 days.  Social alcohol use and admits to occasional marijuana use.  In the ED, temperature 98.5 F, HR 90, RR 16, BP 166/91, SpO2 98% on room air.  WBC 7.6, hemoglobin 13.5, platelet count 334.  Sodium 139, potassium 3.8, chloride 110, CO2 21, glucose 93, BUN 9, creatinine 0.83.  AST 12, ALT 12, total bilirubin 0.4.  CT head without  contrast with no evidence of acute intracranial abnormality.  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  MR brain without contrast with small acute pontine infarcts, small chronic right cerebellar infarct.  Neurology was consulted.  TRH consulted for admission for further evaluation and management of acute CVA.   Hospital course:  Acute ischemic CVA Patient presenting to the ED with dizziness, right-sided paresthesias and slurred speech.  CT head without contrast with no evidence of acute intracranial abnormality.  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  MR brain without contrast with small acute pontine infarcts, small chronic right cerebellar infarct.  Neurology was consulted and followed during hospital course.  Hemoglobin A1c 5.5, LDL 152.  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  Will continue DAPT with aspirin and Plavix x 3 months followed by aspirin alone.  Atorvastatin was increased to 80 mg p.o. daily.  Tobacco  Schedule an appointment as soon as possible for a visit in 6 week(s).   Specialty: Neurology Contact information: [address] [place] [address] [phone]               Allergies  Allergen Reactions   Codeine Itching   Doxycycline     Consultations: Neurology   Procedures/Studies: ECHOCARDIOGRAM COMPLETE  Result Date: 12/28/2022    ECHOCARDIOGRAM REPORT   Patient Name:   [facility] Date of Exam: 12/28/2022 Medical Rec #:  [id number]             Height:       65.0 in Accession #:    [id number]            Weight:       165.0 lb Date of Birth:  May 06, 1965            BSA:          1.823 m Patient Age:    56 years              BP:            160/80 mmHg Patient Gender: F                     HR:           80 bpm. Exam Location:  Inpatient Procedure: 2D Echo, Cardiac Doppler and Color Doppler Indications:    Stroke  History:        Patient has no prior history of Echocardiogram examinations.                 Stroke; Risk Factors:Hypertension, Current Smoker, Dyslipidemia                 and Diabetes.  Sonographer:    Dondra Prader RVT RCS Referring Phys: [id number] CHING T TU IMPRESSIONS  1. Left ventricular ejection fraction, by estimation, is 55 to 60%. The left ventricle has normal function. The left ventricle has no regional wall motion abnormalities. Left ventricular diastolic parameters are indeterminate.  2. Right ventricular systolic function is normal. The right ventricular size is normal. Tricuspid regurgitation signal is inadequate for assessing PA pressure.  3. 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 No evidence of mitral stenosis. 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  4. The aortic valve is tricuspid. Aortic valve regurgitation is not visualized. Aortic valve sclerosis/calcification is present, without any evidence of aortic stenosis.  5. The inferior vena cava is dilated in size with <50% respiratory variability, suggesting right atrial pressure of 15 mmHg. Conclusion(s)/Recommendation(s): No intracardiac source of embolism detected on this transthoracic study. Consider a transesophageal echocardiogram to exclude cardiac source of embolism if clinically indicated. FINDINGS  Left Ventricle: Left ventricular ejection fraction, by estimation, is 55 to 60%. The left ventricle has normal function. The left ventricle has no regional wall motion abnormalities. The left ventricular internal cavity size was normal in size. There is  no left ventricular hypertrophy. Left ventricular diastolic parameters are indeterminate. Normal left  ventricular filling pressure. Right Ventricle: The right ventricular size is normal. No increase in right ventricular wall thickness. Right ventricular systolic function is normal. Tricuspid regurgitation signal is inadequate for assessing PA pressure. Left Atrium: Left atrial size was normal in size. Right Atrium: Right atrial size was normal in size. Pericardium: There is no evidence of pericardial  Physician Discharge Summary  Madeline Blankenship NWG:[id number] DOB: July 31, 1965 DOA: 12/27/2022  PCP: Arnette Felts, FNP  Admit date: 12/27/2022 Discharge date: 12/28/2022  Admitted From: Home Disposition: Home  Recommendations for Outpatient Follow-up:  Follow up with PCP in 1-2 weeks Follow-up with neurology and 6-8 weeks for stroke follow-up Referral placed to pulmonology for lung cancer screening Continue DAPT with aspirin and Plavix x 3 months followed by aspirin alone Atorvastatin increased to 80 mg p.o. daily Continue to encourage tobacco cessation  Home Health: No needs identified by PT/OT Equipment/Devices: None  Discharge Condition: Stable CODE STATUS: Full code Diet recommendation: Heart healthy/consistent carbohydrate diet  History of present illness:  Madeline Blankenship is a 57 year old female with past medical history significant for HTN, type 2 diabetes mellitus, hyperlipidemia, obesity who presented to [facility] ED on 9/24 from home with complaints of dizziness and right-sided paresthesia.  Patient was sitting on her bed, talking with family around 9 PM instantly felt dizzy and slid down to the ground.  Complained of numbness to her right fingers and toes and family noted slurred speech.  Patient family monitor her BP throughout the night and SBP was elevated but never greater than 200.  As she woke up today, continued to have numbness in her hands and feet so apparently brought her to the ED for further evaluation.  She recently ran out of her metformin, statin and blood pressure medicines.  She does endorse using tobacco, smoking 1 pack every 2 days.  Social alcohol use and admits to occasional marijuana use.  In the ED, temperature 98.5 F, HR 90, RR 16, BP 166/91, SpO2 98% on room air.  WBC 7.6, hemoglobin 13.5, platelet count 334.  Sodium 139, potassium 3.8, chloride 110, CO2 21, glucose 93, BUN 9, creatinine 0.83.  AST 12, ALT 12, total bilirubin 0.4.  CT head without  contrast with no evidence of acute intracranial abnormality.  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  MR brain without contrast with small acute pontine infarcts, small chronic right cerebellar infarct.  Neurology was consulted.  TRH consulted for admission for further evaluation and management of acute CVA.   Hospital course:  Acute ischemic CVA Patient presenting to the ED with dizziness, right-sided paresthesias and slurred speech.  CT head without contrast with no evidence of acute intracranial abnormality.  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  MR brain without contrast with small acute pontine infarcts, small chronic right cerebellar infarct.  Neurology was consulted and followed during hospital course.  Hemoglobin A1c 5.5, LDL 152.  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  Will continue DAPT with aspirin and Plavix x 3 months followed by aspirin alone.  Atorvastatin was increased to 80 mg p.o. daily.  Tobacco  Schedule an appointment as soon as possible for a visit in 6 week(s).   Specialty: Neurology Contact information: [address] [place] [address] [phone]               Allergies  Allergen Reactions   Codeine Itching   Doxycycline     Consultations: Neurology   Procedures/Studies: ECHOCARDIOGRAM COMPLETE  Result Date: 12/28/2022    ECHOCARDIOGRAM REPORT   Patient Name:   [facility] Date of Exam: 12/28/2022 Medical Rec #:  [id number]             Height:       65.0 in Accession #:    [id number]            Weight:       165.0 lb Date of Birth:  May 06, 1965            BSA:          1.823 m Patient Age:    56 years              BP:            160/80 mmHg Patient Gender: F                     HR:           80 bpm. Exam Location:  Inpatient Procedure: 2D Echo, Cardiac Doppler and Color Doppler Indications:    Stroke  History:        Patient has no prior history of Echocardiogram examinations.                 Stroke; Risk Factors:Hypertension, Current Smoker, Dyslipidemia                 and Diabetes.  Sonographer:    Dondra Prader RVT RCS Referring Phys: [id number] CHING T TU IMPRESSIONS  1. Left ventricular ejection fraction, by estimation, is 55 to 60%. The left ventricle has normal function. The left ventricle has no regional wall motion abnormalities. Left ventricular diastolic parameters are indeterminate.  2. Right ventricular systolic function is normal. The right ventricular size is normal. Tricuspid regurgitation signal is inadequate for assessing PA pressure.  3. 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 No evidence of mitral stenosis. 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  4. The aortic valve is tricuspid. Aortic valve regurgitation is not visualized. Aortic valve sclerosis/calcification is present, without any evidence of aortic stenosis.  5. The inferior vena cava is dilated in size with <50% respiratory variability, suggesting right atrial pressure of 15 mmHg. Conclusion(s)/Recommendation(s): No intracardiac source of embolism detected on this transthoracic study. Consider a transesophageal echocardiogram to exclude cardiac source of embolism if clinically indicated. FINDINGS  Left Ventricle: Left ventricular ejection fraction, by estimation, is 55 to 60%. The left ventricle has normal function. The left ventricle has no regional wall motion abnormalities. The left ventricular internal cavity size was normal in size. There is  no left ventricular hypertrophy. Left ventricular diastolic parameters are indeterminate. Normal left  ventricular filling pressure. Right Ventricle: The right ventricular size is normal. No increase in right ventricular wall thickness. Right ventricular systolic function is normal. Tricuspid regurgitation signal is inadequate for assessing PA pressure. Left Atrium: Left atrial size was normal in size. Right Atrium: Right atrial size was normal in size. Pericardium: There is no evidence of pericardial  Schedule an appointment as soon as possible for a visit in 6 week(s).   Specialty: Neurology Contact information: [address] [place] [address] [phone]               Allergies  Allergen Reactions   Codeine Itching   Doxycycline     Consultations: Neurology   Procedures/Studies: ECHOCARDIOGRAM COMPLETE  Result Date: 12/28/2022    ECHOCARDIOGRAM REPORT   Patient Name:   [facility] Date of Exam: 12/28/2022 Medical Rec #:  [id number]             Height:       65.0 in Accession #:    [id number]            Weight:       165.0 lb Date of Birth:  May 06, 1965            BSA:          1.823 m Patient Age:    56 years              BP:            160/80 mmHg Patient Gender: F                     HR:           80 bpm. Exam Location:  Inpatient Procedure: 2D Echo, Cardiac Doppler and Color Doppler Indications:    Stroke  History:        Patient has no prior history of Echocardiogram examinations.                 Stroke; Risk Factors:Hypertension, Current Smoker, Dyslipidemia                 and Diabetes.  Sonographer:    Dondra Prader RVT RCS Referring Phys: [id number] CHING T TU IMPRESSIONS  1. Left ventricular ejection fraction, by estimation, is 55 to 60%. The left ventricle has normal function. The left ventricle has no regional wall motion abnormalities. Left ventricular diastolic parameters are indeterminate.  2. Right ventricular systolic function is normal. The right ventricular size is normal. Tricuspid regurgitation signal is inadequate for assessing PA pressure.  3. 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 No evidence of mitral stenosis. 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  4. The aortic valve is tricuspid. Aortic valve regurgitation is not visualized. Aortic valve sclerosis/calcification is present, without any evidence of aortic stenosis.  5. The inferior vena cava is dilated in size with <50% respiratory variability, suggesting right atrial pressure of 15 mmHg. Conclusion(s)/Recommendation(s): No intracardiac source of embolism detected on this transthoracic study. Consider a transesophageal echocardiogram to exclude cardiac source of embolism if clinically indicated. FINDINGS  Left Ventricle: Left ventricular ejection fraction, by estimation, is 55 to 60%. The left ventricle has normal function. The left ventricle has no regional wall motion abnormalities. The left ventricular internal cavity size was normal in size. There is  no left ventricular hypertrophy. Left ventricular diastolic parameters are indeterminate. Normal left  ventricular filling pressure. Right Ventricle: The right ventricular size is normal. No increase in right ventricular wall thickness. Right ventricular systolic function is normal. Tricuspid regurgitation signal is inadequate for assessing PA pressure. Left Atrium: Left atrial size was normal in size. Right Atrium: Right atrial size was normal in size. Pericardium: There is no evidence of pericardial

## 2022-12-28 NOTE — TOC Initial Note (Signed)
Transition of Care [facility]) - Initial/Assessment Note    Patient Details  Name: Madeline Blankenship MRN: [id number] Date of Birth: November 02, 1965  Transition of Care [facility]) CM/SW Contact:    Kermit Balo, RN Phone Number: 12/28/2022, 2:33 PM  Clinical Narrative:                 Patient is from home with her daughter.  Pt is without insurance and unable to see PCP. Pt was interested in getting an appointment with one of the [facility]ics. Appointment on the AVS. CM also provided information on pharmacy pt can use for assistance with her medications. No DME at home. Pt drives self as needed.  Pt has transportation  home when medically ready for d/c.   Expected Discharge Plan: Home/Self Care Barriers to Discharge: Continued Medical Work up   Patient Goals and CMS Choice            Expected Discharge Plan and Services   Discharge Planning Services: CM Consult   Living arrangements for the past 2 months: Single Family Home                                      Prior Living Arrangements/Services Living arrangements for the past 2 months: Single Family Home Lives with:: Adult Children Patient language and need for interpreter reviewed:: Yes Do you feel safe going back to the place where you live?: Yes            Criminal Activity/Legal Involvement Pertinent to Current Situation/Hospitalization: No - Comment as needed  Activities of Daily Living   ADL Screening (condition at time of admission) Does the patient have a NEW difficulty with bathing/dressing/toileting/self-feeding that is expected to last >3 days?: No Does the patient have a NEW difficulty with getting in/out of bed, walking, or climbing stairs that is expected to last >3 days?: No Does the patient have a NEW difficulty with communication that is expected to last >3 days?: No Is the patient deaf or have difficulty hearing?: No Does the patient have difficulty seeing, even when wearing glasses/contacts?:  No Does the patient have difficulty concentrating, remembering, or making decisions?: No  Permission Sought/Granted                  Emotional Assessment Appearance:: Appears stated age Attitude/Demeanor/Rapport: Engaged Affect (typically observed): Accepting Orientation: : Oriented to Self, Oriented to Place, Oriented to  Time, Oriented to Situation   Psych Involvement: No (comment)  Admission diagnosis:  Left pontine stroke [facility]) [I63.9] Acute CVA (cerebrovascular accident) [facility]) [I63.9] Patient Active Problem List   Diagnosis Date Noted   Left pontine stroke (HCC) 12/27/2022   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 ([place]), [place] - [address] [place] ([place]) [place] [address] Phone: [phone] Fax: [phone]     Social Determinants of Health (SDOH) Social History: SDOH Screenings   Food Insecurity: Food Insecurity Present (12/28/2022)  Housing: Medium Risk (12/28/2022)  Transportation Needs: Unmet Transportation Needs (12/28/2022)  Utilities: At Risk (12/28/2022)  Depression (PHQ2-9): Low Risk  (03/15/2022)  Tobacco Use: High Risk (12/27/2022)   SDOH Interventions:     Readmission Risk Interventions     No data to display

## 2022-12-28 NOTE — Progress Notes (Addendum)
STROKE TEAM PROGRESS NOTE   BRIEF HPI [name] is a 57 y.o. female with history of DM2, essential HTN, HLD, current everyday smoker presenting with right sided weakness and numbness. Patient reports lower face, right arm, and right leg weakness whole in bed that prevented her from getting out of bed and ambulating for approximately 20 minutes before resolving.  On initial neurology evaluation, patient had residual numbness in her right fingers with resolution of all other symptoms.  SIGNIFICANT HOSPITAL EVENTS 12/27/2022: MRI brain obtained revealing small acute pontine infarcts and a small chronic right cerebellar infarct. 12/27/22: Negative CTA for LVO or other emergent finding.  Fetal type origin of the PCAs with overall diminutive vertebrobasilar system.  Superimposed focal severe proximal basilar artery stenosis.  Mild atheromatous change about the carotid siphons and carotid bifurcations without hemodynamically significant stenosis.    INTERIM HISTORY/SUBJECTIVE Patient is sitting up at bedside on ED stretcher. Patient's aunt at bedside. Patient is in no acute distress on neurology evaluation.  Vital signs stable.  Neurologic exam stable.  No residual deficits noted.  Long discussion with patient about her increased risk for future strokes. Dr. Pearlean Brownie educated regarding modifiable risk factors including diet, exercise, tobacco cessation, lifestyle choices  Discussion of CAPTIVA trial at bedside with patient and Dr. Pearlean Brownie. Patient is agreeable, Dr. Pearlean Brownie to provide pamphlet of information to patient prior to discharge.   OBJECTIVE CBC    Component Value Date/Time   WBC 7.6 12/27/2022 1007   RBC 4.56 12/27/2022 1007   HGB 13.5 12/27/2022 1007   HGB 13.8 03/15/2022 1543   HCT 41.6 12/27/2022 1007   HCT 41.6 03/15/2022 1543   PLT 334 12/27/2022 1007   PLT 351 03/15/2022 1543   MCV 91.2 12/27/2022 1007   MCV 89 03/15/2022 1543   MCH 29.6 12/27/2022 1007   MCHC 32.5  12/27/2022 1007   RDW 13.1 12/27/2022 1007   RDW 12.4 03/15/2022 1543   LYMPHSABS 3.2 12/27/2022 1007   MONOABS 0.4 12/27/2022 1007   EOSABS 0.0 12/27/2022 1007   BASOSABS 0.0 12/27/2022 1007   BMET    Component Value Date/Time   NA 139 12/27/2022 1007   NA 144 03/15/2022 1543   K 3.8 12/27/2022 1007   CL 110 12/27/2022 1007   CO2 21 (L) 12/27/2022 1007   GLUCOSE 93 12/27/2022 1007   BUN 9 12/27/2022 1007   BUN 15 03/15/2022 1543   CREATININE 0.83 12/27/2022 1007   CALCIUM 8.6 (L) 12/27/2022 1007   EGFR 67 03/15/2022 1543   GFRNONAA >60 12/27/2022 1007   IMAGING past 24 hours CT ANGIO HEAD NECK W WO CM  Result Date: 12/28/2022 CLINICAL DATA:  Follow-up examination for stroke. EXAM: CT ANGIOGRAPHY HEAD AND NECK WITH AND WITHOUT CONTRAST TECHNIQUE: Multidetector CT imaging of the head and neck was performed using the standard protocol during bolus administration of intravenous contrast. Multiplanar CT image reconstructions and MIPs were obtained to evaluate the vascular anatomy. Carotid stenosis measurements (when applicable) are obtained utilizing NASCET criteria, using the distal internal carotid diameter as the denominator. RADIATION DOSE REDUCTION: This exam was performed according to the departmental dose-optimization program which includes automated exposure control, adjustment of the mA and/or kV according to patient size and/or use of iterative reconstruction technique. CONTRAST:  75mL OMNIPAQUE IOHEXOL 350 MG/ML SOLN COMPARISON:  Prior CT and MRI from earlier the same day. FINDINGS: CT HEAD FINDINGS Brain: Cerebral volume within normal limits. Previously identified subcentimeter pontine infarcts not visible by CT.  No other acute large vessel territory infarct. No intracranial hemorrhage. No mass lesion or midline shift. No hydrocephalus or extra-axial fluid collection. Vascular: No abnormal hyperdense vessel. Scattered vascular calcifications noted within the carotid siphons. Skull:  Scalp soft tissues demonstrate no acute finding. Calvarium intact. Sinuses/Orbits: Remote posttraumatic defect noted at the left lamina papyracea. Globes and orbital soft tissues demonstrate no acute finding. Small volume pneumatized secretions noted within the right posterior ethmoidal air cells. Visualized paranasal sinuses are otherwise clear. No mastoid effusion. Other: None. Review of the MIP images confirms the above findings CTA NECK FINDINGS Aortic arch: Age aortic arch within normal limits for caliber. Moderate atheromatous change about the arch itself. Bovine branching pattern noted. No stenosis about the origin the great vessels. Right carotid system: Right common and internal carotid arteries are patent without dissection. Mild atheromatous change about the proximal cervical right ICA without stenosis. Mild irregularity about the mid cervical right ICA could be related to chronic hypertension or possibly FMD. Left carotid system: Left common and internal carotid arteries are patent without dissection. Mild atheromatous change about the proximal cervical left ICA without stenosis. Minimal irregularity about the mid cervical left ICA could reflect changes of chronic hypertension or possibly mild FMD. Vertebral arteries: Both vertebral arteries arise from subclavian arteries. No proximal subclavian artery stenosis. Right vertebral artery slightly dominant. Vertebral arteries patent without stenosis or dissection. Skeleton: No discrete or worrisome osseous lesions. Moderate spondylosis present C6-7. Other neck: No other acute abnormality. Upper chest: Mild emphysema.  No other acute abnormality. Review of the MIP images confirms the above findings CTA HEAD FINDINGS Anterior circulation: Atheromatous change about the carotid siphons without hemodynamically significant stenosis. A1 segments patent bilaterally. Normal anterior communicating artery complex. Anterior cerebral arteries patent without stenosis. No  M1 stenosis or occlusion. Distal MCA branches perfused and symmetric. Posterior circulation: Both V4 segments patent without stenosis. Both PICA patent. Focal severe stenosis at the proximal basilar artery (series 9, image 130). Basilar is diminutive but patent distally. Superior cerebral arteries patent bilaterally. Fetal type origin of the PCAs. Both PCAs patent to their distal aspects without stenosis. Venous sinuses: Grossly patent allowing for timing the contrast bolus. Anatomic variants: Fetal type origin of the PCAs with overall diminutive vertebrobasilar system. No intracranial aneurysm. Review of the MIP images confirms the above findings IMPRESSION: CT HEAD IMPRESSION: No acute intracranial abnormality. Previously identified subcentimeter pontine infarcts not visible by CT. CTA HEAD AND NECK IMPRESSION: 1. Negative CTA for large vessel occlusion or other emergent finding. 2. Fetal type origin of the PCAs with overall diminutive vertebrobasilar system. 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 3. Mild atheromatous change about the carotid siphons and carotid bifurcations without hemodynamically significant stenosis. 4. Mild irregularity about the mid cervical ICAs bilaterally, which could reflect changes of chronic hypertension or possibly mild FMD. Electronically Signed   By: Rise Mu M.D.   On: 12/28/2022 00:53   MR BRAIN WO CONTRAST  Result Date: 12/27/2022 CLINICAL DATA:  Transient ischemic attack (TIA). EXAM: MRI HEAD WITHOUT CONTRAST TECHNIQUE: Multiplanar, multiecho pulse sequences of the brain and surrounding structures were obtained without intravenous contrast. COMPARISON:  Head CT 12/27/2022 FINDINGS: Brain: 5 mm acute infarct in the left dorsal pons and 2 mm acute infarct in the right central pons. Small chronic right cerebellar infarct. No intracranial hemorrhage, mass, midline shift, or extra-axial fluid collection. Few punctate foci of T2  hyperintensity in the cerebral white matter, nonspecific and not atypical for age. Normal  brain volume and ventricle size. Vascular: Major intracranial vascular flow voids are preserved. Skull and upper cervical spine: Unremarkable bone marrow signal. Sinuses/Orbits: Remote medial left orbital fracture. Secretions in a posterior right ethmoid air cell. Clear mastoid air cells. Other: None. IMPRESSION: 1. Small acute pontine infarcts. 2. Small chronic right cerebellar infarct. Electronically Signed   By: Sebastian Ache M.D.   On: 12/27/2022 18:38   CT HEAD WO CONTRAST  Result Date: 12/27/2022 CLINICAL DATA:  Neuro deficit, acute, stroke suspected EXAM: CT HEAD WITHOUT CONTRAST TECHNIQUE: Contiguous axial images were obtained from the base of the skull through the vertex without intravenous contrast. RADIATION DOSE REDUCTION: This exam was performed according to the departmental dose-optimization program which includes automated exposure control, adjustment of the mA and/or kV according to patient size and/or use of iterative reconstruction technique. COMPARISON:  None Available. FINDINGS: Brain: No evidence of acute large vascular territory infarction, hemorrhage, hydrocephalus, extra-axial collection or mass lesion/mass effect. Vascular: No hyperdense vessel identified. Skull: No acute fracture. Sinuses/Orbits: Remote appearing left medial orbital wall fracture. Frothy secretions in a posterior right ethmoid air cell. Other: No mastoid effusions. IMPRESSION: No evidence of acute intracranial abnormality. Electronically Signed   By: Feliberto Harts M.D.   On: 12/27/2022 12:55    Vitals:   12/27/22 2353 12/28/22 0000 12/28/22 0100 12/28/22 0439  BP: (!) 143/74 (!) 148/79  (!) 158/88  Pulse: 71 65 67 70  Resp: (!) 22 19 19 20   Temp: 98.4 F (36.9 C)   97.7 F (36.5 C)  TempSrc: Oral   Oral  SpO2: 100% 99% 100% 100%  Weight:      Height:       PHYSICAL EXAM General:  Alert, well-nourished,  well-developed African American female in no acute distress Psych:  Mood and affect appropriate for situation, calm and cooperative with examination  CV: Regular rate and rhythm on monitor Respiratory:  Regular, unlabored respirations on room air GI: Abdomen soft and nontender  NEURO:  Mental Status: AA&Ox3, patient is able to give clear and coherent history Speech/Language: speech is without dysarthria or aphasia.  Naming, repetition, fluency, and comprehension intact.  Cranial Nerves:  II: PERRL. Visual fields full.  III, IV, VI: EOMI without gaze preference or diplopia  V: Sensation is intact to light touch and symmetrical to face.  VII: Face is symmetrical resting and with movement VIII: Hearing intact to voice. IX, X: Palate elevates symmetrically. Phonation is normal.  DG:[id number] shrug 5/5. XII: Tongue protrudes midline Motor: 5/5 strength to all muscle groups tested. No asymmetry or unilateral weakness noted.  Tone is normal and bulk is normal Sensation: Intact to light touch bilaterally. Extinction absent to light touch to DSS.   Coordination: FTN intact bilaterally, HKS: no ataxia in BLE.No drift.  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  Fetal type origin of the PCAs with overall diminutive vertebrobasilar system.  Superimposed focal severe proximal basilar artery stenosis.  Mild atheromatous change about the carotid siphons and carotid bifurcations without hemodynamically significant stenosis.  Mild irregularity about the mid cervical ICAs bilaterally which could reflect changes of chronic hypertension or possibly mild FMD. MRI 9/24: Small acute pontine infarct.  Small chronic right cerebellar  infarct. 2D Echo ordered, pending  LDL 152 HgbA1c 5.5 VTE  prophylaxis - Lovenox SQ No antithrombotic prior to admission, now on aspirin 81 mg daily and clopidogrel 75 mg daily for 3 months and then aspirin alone. Therapy recommendations:  No follow up needed Disposition:  Discharge to home once stroke work up is complete  Hypertension Home meds: Amlodipine Stable Gradual normotension  Hyperlipidemia Home meds: Atorvastatin 20 mg LDL 152, goal < 70 Increase atorvastatin to 80 mg p.o. daily Continue statin at discharge  Diabetes type II Controlled Home meds:  Metformin HgbA1c 5.5, goal < 7.0 CBGs SSI as needed  Tobacco Abuse Patient smokes 1 packs per day for 30 years       Ready to quit? Yes Smoking cessation education provided at bedside  Other Stroke Risk Factors Family hx stroke (father)  Other Active Problems   Hospital day # 0  Lanae Boast, AGACNP-BC [facility] Pager: [phone]  STROKE MD NOTE :  I have personally obtained history,examined this patient, reviewed notes, independently viewed imaging studies, participated in medical decision making and plan of care.ROS completed by me personally and pertinent positives fully documented  I have made any additions or clarifications directly to the above note. Agree with note above.  Patient presented with transient face arm and leg numbness and MRI scan shows bilateral right greater than left pontine infarct with CT angiogram showing severe proximal basilar stenosis on top of underlying hypoplastic posterior circulation.  Patient remains at risk for recurrent strokes and recommend dual antiplatelet therapy aspirin Plavix for 3 months followed by aspirin alone and aggressive risk factor modifications.  Patient counseled to quit smoking cigarettes and to eat a healthy diet and be active.  Consider possible participation in the CAPTIVA stroke prevention trial for symptomatic stenosis.  Patient appears  interested and will be given information to review at home and decide and call us if interested.  Greater than 50% time during this 50-minute visit was spent in counseling and coordination of care about his symptomatic basilar artery stenosis and discussion of stroke evaluation and treatment and answering questions.  Delia Heady, MD Medical Director [facility] Pager: [phone] 12/28/2022 3:18 PM  To contact Stroke Continuity provider, please refer to [email or web address]. After hours, contact General Neurology

## 2022-12-28 NOTE — Evaluation (Signed)
Occupational Therapy Evaluation Patient Details Name: Madeline Blankenship MRN: [id number] DOB: [birth year] Today's Date: 12/28/2022   History of Present Illness Pt is 57 yo presenting to [facility] ED due to sudden onset dizziness, slurred speech, numbness of hands/feet and elevated BP. Pt found to have small acute pontine infarct and small chronic R cerebellar infarct with MRI. PMH: DM, Hyperlipidemia, HTN.   Clinical Impression   Pt currently at baseline functionally of independence with selfcare tasks and related transfers without an assistive device which is her baseline  She reports slight numbness in the 3rd and 4th digits of the right hand but coordination and strength are WFLs.  No further acute or post acute OT needs at this time.           Functional Status Assessment  Patient has not had a recent decline in their functional status  Equipment Recommendations  None recommended by OT       Precautions / Restrictions Precautions Precautions: None Restrictions Weight Bearing Restrictions: No      Mobility Bed Mobility Overal bed mobility: Independent                  Transfers Overall transfer level: Independent Equipment used: None                      Balance Overall balance assessment: Mild deficits observed, not formally tested (Pt with difficulty holding unilateral stance, but she reports having difficulty with this at baseline and demonstrates appropriate stepping strategy.)                                         ADL either performed or assessed with clinical judgement   ADL Overall ADL's : Independent                                       General ADL Comments: Pt with most sypmtoms resolved except slight numbness in fingertips of middle and ring finger on the right hand but not affecting functional use.  Pt overall independent for simulated and completed ADLs.     Vision Baseline Vision/History: 1 Wears  glasses Ability to See in Adequate Light: 0 Adequate Patient Visual Report: No change from baseline Vision Assessment?: No apparent visual deficits     Perception Perception: Not tested       Praxis Praxis: Not tested       Pertinent Vitals/Pain Pain Assessment Pain Assessment: No/denies pain     Extremity/Trunk Assessment Upper Extremity Assessment Upper Extremity Assessment: Overall WFL for tasks assessed   Lower Extremity Assessment Lower Extremity Assessment: Defer to PT evaluation   Cervical / Trunk Assessment Cervical / Trunk Assessment: Normal   Communication Communication Communication: No apparent difficulties Cueing Techniques: Verbal cues   Cognition Arousal: Alert Behavior During Therapy: WFL for tasks assessed/performed Overall Cognitive Status: Within Functional Limits for tasks assessed                                       General Comments  No noted skin issues. Pt has some difficutly with RAMPS and accuracy with coordination exercises with the hands with R>L            Home  Living Family/patient expects to be discharged to:: Private residence Living Arrangements: Children (daughter) Available Help at Discharge: Available PRN/intermittently Type of Home: House Home Access: Stairs to enter Secretary/administrator of Steps: 2 Entrance Stairs-Rails: None Home Layout: One level     Bathroom Shower/Tub: Producer, television/film/video: Standard Bathroom Accessibility: Yes   Home Equipment: None   Additional Comments: working in childcare      Prior Functioning/Environment Prior Level of Function : Independent/Modified Independent;Driving;Working/employed             Mobility Comments: Pt reports she was independent ADLs Comments: Pt reports that she is independent         AM-PAC OT "6 Clicks" Daily Activity     Outcome Measure Help from another person eating meals?: None Help from another person taking care of  personal grooming?: None Help from another person toileting, which includes using toliet, bedpan, or urinal?: None Help from another person bathing (including washing, rinsing, drying)?: None Help from another person to put on and taking off regular upper body clothing?: None Help from another person to put on and taking off regular lower body clothing?: None 6 Click Score: 24   End of Session Nurse Communication: Mobility status  Activity Tolerance: Patient tolerated treatment well Patient left: in bed;with call bell/phone within reach                   Time: 1000-1016 OT Time Calculation (min): 16 min Charges:  OT General Charges $OT Visit: 1 Visit OT Evaluation $OT Eval Low Complexity: 1 Low Perrin Maltese, OTR/L Acute Rehabilitation Services  Office [phone] 12/28/2022

## 2022-12-28 NOTE — Plan of Care (Signed)
  Problem: Coping: Goal: Will verbalize positive feelings about self Outcome: Progressing   Problem: Self-Care: Goal: Ability to participate in self-care as condition permits will improve Outcome: Progressing Goal: Verbalization of feelings and concerns over difficulty with self-care will improve Outcome: Progressing

## 2022-12-28 NOTE — ED Notes (Signed)
ED TO INPATIENT HANDOFF REPORT  ED Nurse Name and Phone #: [phone] Charletha Dalpe S Name/Age/Gender Madeline Blankenship 57 y.o. female Room/Bed: 044C/044C  Code Status   Code Status: Full Code  Home/SNF/Other Home Patient oriented to: self, place, time, and situation Is this baseline? Yes   Triage Complete: Triage complete  Chief Complaint Left pontine stroke [facility]) [I63.9]  Triage Note Pt c.o episode of numbness, slurred speech and confusion lasting about 20 mins last night around 9pm. Pt still c.o numbness in her right hand and right foot. Denies any other symptoms. No weakness noted in triage.   Allergies Allergies  Allergen Reactions   Codeine Itching   Doxycycline     Level of Care/Admitting Diagnosis ED Disposition     ED Disposition  Admit   Condition  --   Comment  Hospital Area: [facility] [[id number]]  Level of Care: Telemetry Medical [104]  May place patient in observation at [facility] or [facility] if equivalent level of care is available:: No  Covid Evaluation: Asymptomatic - no recent exposure (last 10 days) testing not required  Diagnosis: Left pontine stroke [facility]) [[id number]]  Admitting Physician: Anselm Jungling [[id number]]  Attending Physician: Anselm Jungling [[id number]]          B Medical/Surgery History Past Medical History:  Diagnosis Date   Diabetes mellitus without complication (HCC)    Hyperlipidemia    Hypertension    Past Surgical History:  Procedure Laterality Date   ABDOMINAL HYSTERECTOMY     BREAST EXCISIONAL BIOPSY Right    partial historectomy       A IV Location/Drains/Wounds Patient Lines/Drains/Airways Status     Active Line/Drains/Airways     Name Placement date Placement time Site Days   Peripheral IV 12/27/22 20 G 1" Anterior;Right Forearm 12/27/22  2145  Forearm  1            Intake/Output Last 24 hours No intake or output data in the 24 hours ending 12/28/22 1121  Labs/Imaging Results for orders  placed or performed during the hospital encounter of 12/27/22 (from the past 48 hour(s))  Protime-INR     Status: None   Collection Time: 12/27/22 10:07 AM  Result Value Ref Range   Prothrombin Time 13.0 11.4 - 15.2 seconds   INR 1.0 0.8 - 1.2    Comment: (NOTE) INR goal varies based on device and disease states. Performed at [facility], [address] [place], [place] [address]   APTT     Status: None   Collection Time: 12/27/22 10:07 AM  Result Value Ref Range   aPTT 29 24 - 36 seconds    Comment: Performed at [facility], [address] [place], [place] [address]  CBC     Status: None   Collection Time: 12/27/22 10:07 AM  Result Value Ref Range   WBC 7.6 4.0 - 10.5 K/uL   RBC 4.56 3.87 - 5.11 MIL/uL   Hemoglobin 13.5 12.0 - 15.0 g/dL   HCT 74.2 59.5 - 63.8 %   MCV 91.2 80.0 - 100.0 fL   MCH 29.6 26.0 - 34.0 pg   MCHC 32.5 30.0 - 36.0 g/dL   RDW 75.6 43.3 - 29.5 %   Platelets 334 150 - 400 K/uL   nRBC 0.0 0.0 - 0.2 %    Comment: Performed at [facility], [address], [place], [place] [address]  Differential     Status: None  Collection Time: 12/27/22 10:07 AM  Result Value Ref Range   Neutrophils Relative % 51 %   Neutro Abs 3.9 1.7 - 7.7 K/uL   Lymphocytes Relative 43 %   Lymphs Abs 3.2 0.7 - 4.0 K/uL   Monocytes Relative 5 %   Monocytes Absolute 0.4 0.1 - 1.0 K/uL   Eosinophils Relative 0 %   Eosinophils Absolute 0.0 0.0 - 0.5 K/uL   Basophils Relative 1 %   Basophils Absolute 0.0 0.0 - 0.1 K/uL   Immature Granulocytes 0 %   Abs Immature Granulocytes 0.02 0.00 - 0.07 K/uL    Comment: Performed at [facility], [address], [place], [place] [address]  Comprehensive metabolic panel     Status: Abnormal   Collection Time: 12/27/22 10:07 AM  Result Value Ref Range   Sodium 139 135 - 145 mmol/L   Potassium 3.8 3.5 - 5.1 mmol/L   Chloride 110 98 - 111 mmol/L   CO2 21 (L) 22 - 32 mmol/L   Glucose, Bld 93 70 - 99 mg/dL    Comment:  Glucose reference range applies only to samples taken after fasting for at least 8 hours.   BUN 9 6 - 20 mg/dL   Creatinine, Ser 1.19 0.44 - 1.00 mg/dL   Calcium 8.6 (L) 8.9 - 10.3 mg/dL   Total Protein 6.7 6.5 - 8.1 g/dL   Albumin 3.5 3.5 - 5.0 g/dL   AST 12 (L) 15 - 41 U/L   ALT 12 0 - 44 U/L   Alkaline Phosphatase 72 38 - 126 U/L   Total Bilirubin 0.4 0.3 - 1.2 mg/dL   GFR, Estimated >14 >78 mL/min    Comment: (NOTE) Calculated using the CKD-EPI Creatinine Equation (2021)    Anion gap 8 5 - 15    Comment: Performed at [facility], [address] [place], [place] [address]  Ethanol     Status: None   Collection Time: 12/27/22 10:07 AM  Result Value Ref Range   Alcohol, Ethyl (B) <10 <10 mg/dL    Comment: (NOTE) Lowest detectable limit for serum alcohol is 10 mg/dL.  For medical purposes only. Performed at [facility], [address] [place], [place] [address]   CBG monitoring, ED     Status: None   Collection Time: 12/27/22  2:05 PM  Result Value Ref Range   Glucose-Capillary 75 70 - 99 mg/dL    Comment: Glucose reference range applies only to samples taken after fasting for at least 8 hours.   Comment 1 Notify RN    Comment 2 Document in Chart   HIV Antibody (routine testing w rflx)     Status: None   Collection Time: 12/28/22  4:57 AM  Result Value Ref Range   HIV Screen 4th Generation wRfx Non Reactive Non Reactive    Comment: Performed at [facility], [address] [place], [place] [address]  Lipid panel     Status: Abnormal   Collection Time: 12/28/22  4:57 AM  Result Value Ref Range   Cholesterol 211 (H) 0 - 200 mg/dL   Triglycerides 98 <962 mg/dL   HDL 39 (L) >95 mg/dL   Total CHOL/HDL Ratio 5.4 RATIO   VLDL 20 0 - 40 mg/dL   LDL Cholesterol 284 (H) 0 - 99 mg/dL    Comment:        Total Cholesterol/HDL:CHD Risk Coronary Heart Disease Risk Table  Men   Women  1/2 Average Risk   3.4   3.3  Average Risk       5.0    4.4  2 X Average Risk   9.6   7.1  3 X Average Risk  23.4   11.0        Use the calculated Patient Ratio above and the CHD Risk Table to determine the patient's CHD Risk.        ATP III CLASSIFICATION (LDL):  <100     mg/dL   Optimal  086-578  mg/dL   Near or Above                    Optimal  130-159  mg/dL   Borderline  469-629  mg/dL   High  >528     mg/dL   Very High Performed at [facility], [address] [place], [place] [address]   Hemoglobin A1c     Status: None   Collection Time: 12/28/22  4:57 AM  Result Value Ref Range   Hgb A1c MFr Bld 5.5 4.8 - 5.6 %    Comment: (NOTE) Pre diabetes:          5.7%-6.4%  Diabetes:              >6.4%  Glycemic control for   <7.0% adults with diabetes    Mean Plasma Glucose 111.15 mg/dL    Comment: Performed at [facility], [address] [place], [place] [address]   CT ANGIO HEAD NECK W WO CM  Result Date: 12/28/2022 CLINICAL DATA:  Follow-up examination for stroke. EXAM: CT ANGIOGRAPHY HEAD AND NECK WITH AND WITHOUT CONTRAST TECHNIQUE: Multidetector CT imaging of the head and neck was performed using the standard protocol during bolus administration of intravenous contrast. Multiplanar CT image reconstructions and MIPs were obtained to evaluate the vascular anatomy. Carotid stenosis measurements (when applicable) are obtained utilizing NASCET criteria, using the distal internal carotid diameter as the denominator. RADIATION DOSE REDUCTION: This exam was performed according to the departmental dose-optimization program which includes automated exposure control, adjustment of the mA and/or kV according to patient size and/or use of iterative reconstruction technique. CONTRAST:  75mL OMNIPAQUE IOHEXOL 350 MG/ML SOLN COMPARISON:  Prior CT and MRI from earlier the same day. FINDINGS: CT HEAD FINDINGS Brain: Cerebral volume within normal limits. Previously identified subcentimeter pontine infarcts not visible by CT. No other  acute large vessel territory infarct. No intracranial hemorrhage. No mass lesion or midline shift. No hydrocephalus or extra-axial fluid collection. Vascular: No abnormal hyperdense vessel. Scattered vascular calcifications noted within the carotid siphons. Skull: Scalp soft tissues demonstrate no acute finding. Calvarium intact. Sinuses/Orbits: Remote posttraumatic defect noted at the left lamina papyracea. Globes and orbital soft tissues demonstrate no acute finding. Small volume pneumatized secretions noted within the right posterior ethmoidal air cells. Visualized paranasal sinuses are otherwise clear. No mastoid effusion. Other: None. Review of the MIP images confirms the above findings CTA NECK FINDINGS Aortic arch: Age aortic arch within normal limits for caliber. Moderate atheromatous change about the arch itself. Bovine branching pattern noted. No stenosis about the origin the great vessels. Right carotid system: Right common and internal carotid arteries are patent without dissection. Mild atheromatous change about the proximal cervical right ICA without stenosis. Mild irregularity about the mid cervical right ICA could be related to chronic hypertension or possibly FMD. Left carotid system: Left common and internal carotid arteries are patent without  dissection. Mild atheromatous change about the proximal cervical left ICA without stenosis. Minimal irregularity about the mid cervical left ICA could reflect changes of chronic hypertension or possibly mild FMD. Vertebral arteries: Both vertebral arteries arise from subclavian arteries. No proximal subclavian artery stenosis. Right vertebral artery slightly dominant. Vertebral arteries patent without stenosis or dissection. Skeleton: No discrete or worrisome osseous lesions. Moderate spondylosis present C6-7. Other neck: No other acute abnormality. Upper chest: Mild emphysema.  No other acute abnormality. Review of the MIP images confirms the above findings  CTA HEAD FINDINGS Anterior circulation: Atheromatous change about the carotid siphons without hemodynamically significant stenosis. A1 segments patent bilaterally. Normal anterior communicating artery complex. Anterior cerebral arteries patent without stenosis. No M1 stenosis or occlusion. Distal MCA branches perfused and symmetric. Posterior circulation: Both V4 segments patent without stenosis. Both PICA patent. Focal severe stenosis at the proximal basilar artery (series 9, image 130). Basilar is diminutive but patent distally. Superior cerebral arteries patent bilaterally. Fetal type origin of the PCAs. Both PCAs patent to their distal aspects without stenosis. Venous sinuses: Grossly patent allowing for timing the contrast bolus. Anatomic variants: Fetal type origin of the PCAs with overall diminutive vertebrobasilar system. No intracranial aneurysm. Review of the MIP images confirms the above findings IMPRESSION: CT HEAD IMPRESSION: No acute intracranial abnormality. Previously identified subcentimeter pontine infarcts not visible by CT. CTA HEAD AND NECK IMPRESSION: 1. Negative CTA for large vessel occlusion or other emergent finding. 2. Fetal type origin of the PCAs with overall diminutive vertebrobasilar system. 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 3. Mild atheromatous change about the carotid siphons and carotid bifurcations without hemodynamically significant stenosis. 4. Mild irregularity about the mid cervical ICAs bilaterally, which could reflect changes of chronic hypertension or possibly mild FMD. Electronically Signed   By: Rise Mu M.D.   On: 12/28/2022 00:53   MR BRAIN WO CONTRAST  Result Date: 12/27/2022 CLINICAL DATA:  Transient ischemic attack (TIA). EXAM: MRI HEAD WITHOUT CONTRAST TECHNIQUE: Multiplanar, multiecho pulse sequences of the brain and surrounding structures were obtained without intravenous contrast. COMPARISON:  Head CT 12/27/2022  FINDINGS: Brain: 5 mm acute infarct in the left dorsal pons and 2 mm acute infarct in the right central pons. Small chronic right cerebellar infarct. No intracranial hemorrhage, mass, midline shift, or extra-axial fluid collection. Few punctate foci of T2 hyperintensity in the cerebral white matter, nonspecific and not atypical for age. Normal brain volume and ventricle size. Vascular: Major intracranial vascular flow voids are preserved. Skull and upper cervical spine: Unremarkable bone marrow signal. Sinuses/Orbits: Remote medial left orbital fracture. Secretions in a posterior right ethmoid air cell. Clear mastoid air cells. Other: None. IMPRESSION: 1. Small acute pontine infarcts. 2. Small chronic right cerebellar infarct. Electronically Signed   By: Sebastian Ache M.D.   On: 12/27/2022 18:38   CT HEAD WO CONTRAST  Result Date: 12/27/2022 CLINICAL DATA:  Neuro deficit, acute, stroke suspected EXAM: CT HEAD WITHOUT CONTRAST TECHNIQUE: Contiguous axial images were obtained from the base of the skull through the vertex without intravenous contrast. RADIATION DOSE REDUCTION: This exam was performed according to the departmental dose-optimization program which includes automated exposure control, adjustment of the mA and/or kV according to patient size and/or use of iterative reconstruction technique. COMPARISON:  None Available. FINDINGS: Brain: No evidence of acute large vascular territory infarction, hemorrhage, hydrocephalus, extra-axial collection or mass lesion/mass effect. Vascular: No hyperdense vessel identified. Skull: No acute fracture. Sinuses/Orbits: Remote appearing left medial orbital wall fracture.  Frothy secretions in a posterior right ethmoid air cell. Other: No mastoid effusions. IMPRESSION: No evidence of acute intracranial abnormality. Electronically Signed   By: Feliberto Harts M.D.   On: 12/27/2022 12:55    Pending Labs Unresulted Labs (From admission, onward)     Start     Ordered    12/28/22 1025  Rapid urine drug screen (hospital performed)  ONCE - STAT,   STAT        12/28/22 1024            Vitals/Pain Today's Vitals   12/28/22 0100 12/28/22 0439 12/28/22 0923 12/28/22 0925  BP:  (!) 158/88  (!) 154/84  Pulse: 67 70  73  Resp: 19 20  20   Temp:  97.7 F (36.5 C) 98.2 F (36.8 C)   TempSrc:  Oral Oral   SpO2: 100% 100%  100%  Weight:      Height:      PainSc:        Isolation Precautions No active isolations  Medications Medications  sodium chloride flush (NS) 0.9 % injection 3 mL (3 mLs Intravenous Not Given 12/27/22 1524)  acetaminophen (TYLENOL) tablet 650 mg (has no administration in time range)    Or  acetaminophen (TYLENOL) 160 MG/5ML solution 650 mg (has no administration in time range)    Or  acetaminophen (TYLENOL) suppository 650 mg (has no administration in time range)  senna-docusate (Senokot-S) tablet 1 tablet (has no administration in time range)  enoxaparin (LOVENOX) injection 40 mg (40 mg Subcutaneous Given 12/27/22 2143)  aspirin EC tablet 81 mg (81 mg Oral Given 12/28/22 0919)  clopidogrel (PLAVIX) tablet 75 mg (75 mg Oral Given 12/28/22 0919)  atorvastatin (LIPITOR) tablet 80 mg (has no administration in time range)  LORazepam (ATIVAN) tablet 0.5 mg (0.5 mg Oral Given 12/27/22 1607)   stroke: early stages of recovery book ( Does not apply Given 12/28/22 0920)  calcium carbonate (OS-CAL - dosed in mg of elemental calcium) tablet 1,250 mg (1,250 mg Oral Given 12/27/22 2304)  iohexol (OMNIPAQUE) 350 MG/ML injection 75 mL (75 mLs Intravenous Contrast Given 12/27/22 2320)    Mobility walks     Focused Assessments Neuro Assessment Handoff:  Swallow screen pass? Yes    NIH Stroke Scale  Dizziness Present: No Headache Present: No Interval: Initial Level of Consciousness (1a.)   : Alert, keenly responsive LOC Questions (1b. )   : Answers both questions correctly LOC Commands (1c. )   : Performs both tasks correctly Best Gaze (2.  )  : Normal Visual (3. )  : No visual loss Facial Palsy (4. )    : Normal symmetrical movements Motor Arm, Left (5a. )   : No drift Motor Arm, Right (5b. ) : No drift Motor Leg, Left (6a. )  : No drift Motor Leg, Right (6b. ) : No drift Limb Ataxia (7. ): Absent Sensory (8. )  : Normal, no sensory loss Best Language (9. )  : No aphasia Dysarthria (10. ): Normal Extinction/Inattention (11.)   : No Abnormality Complete NIHSS TOTAL: 0     Neuro Assessment: Within Defined Limits Neuro Checks:   Initial (12/27/22 2141)  Has TPA been given? No If patient is a Neuro Trauma and patient is going to OR before floor call report to 4N Charge nurse: [phone] or [phone]   R Recommendations: See Admitting Provider Note  Report given to:   Additional Notes:

## 2022-12-29 ENCOUNTER — Telehealth: Payer: Self-pay

## 2022-12-29 NOTE — Transitions of Care (Post Inpatient/ED Visit) (Signed)
12/29/2022  Name: Madeline Blankenship MRN: [id number] DOB: 08/04/1965  Today's TOC FU Call Status: Today's TOC FU Call Status:: Successful TOC FU Call Completed TOC FU Call Complete Date: 12/29/22 Patient's Name and Date of Birth confirmed.  Transition Care Management Follow-up Telephone Call Date of Discharge: 12/28/22 Discharge Facility: [facility]) Type of Discharge: Inpatient Admission Primary Inpatient Discharge Diagnosis:: cerebral infarction How have you been since you were released from the hospital?: Better Any questions or concerns?: No  Items Reviewed: Did you receive and understand the discharge instructions provided?: Yes Medications obtained,verified, and reconciled?: Yes (Medications Reviewed) Any new allergies since your discharge?: No Dietary orders reviewed?: Yes Do you have support at home?: Yes People in Home: child(ren), adult  Medications Reviewed Today: Medications Reviewed Today     Reviewed by Karena Addison, LPN (Licensed Practical Nurse) on 12/29/22 at 1223  Med List Status: <None>   Medication Order Taking? Sig Documenting Provider Last Dose Status Informant  Accu-Chek Softclix Lancets lancets [id number] No USE UP TO 4 TIMES DAILY AS DIRECTED Arnette Felts, FNP Taking Active Self  amLODipine (NORVASC) 10 MG tablet [id number]  Take 1 tablet (10 mg total) by mouth daily. [place], Eric J, DO  Active   aspirin EC 81 MG tablet [id number]  Take 1 tablet (81 mg total) by mouth daily. Swallow whole. [place], Alvira Philips, DO  Active   clopidogrel (PLAVIX) 75 MG tablet [id number]  Take 1 tablet (75 mg total) by mouth daily. [place], Alvira Philips, DO  Active   glucose blood (ACCU-CHEK GUIDE) test strip [id number] No Use as instructed Arnette Felts, FNP Taking Active Self  ibuprofen (ADVIL) 800 MG tablet [id number] No Take 800 mg by mouth. [provider] unknown Active Self  metFORMIN (GLUCOPHAGE) 850 MG tablet [id number]  Take 1 tablet (850 mg total) by mouth  daily with breakfast. [place], Alvira Philips, DO  Active   olmesartan (BENICAR) 20 MG tablet [id number]  Take 1 tablet (20 mg total) by mouth daily. In morning [place], Alvira Philips, DO  Active             Home Care and Equipment/Supplies: Were Home Health Services Ordered?: NA Any new equipment or medical supplies ordered?: NA  Functional Questionnaire: Do you need assistance with bathing/showering or dressing?: No Do you need assistance with meal preparation?: No Do you need assistance with eating?: No Do you have difficulty maintaining continence: No Do you need assistance with getting out of bed/getting out of a chair/moving?: No Do you have difficulty managing or taking your medications?: No  Follow up appointments reviewed: PCP Follow-up appointment confirmed?: No (no avail appts, sent message to staff to schedule) MD Provider Line Number:[phone] Given: No Specialist Hospital Follow-up appointment confirmed?: Yes Date of Specialist follow-up appointment?: 01/06/23 Follow-Up Specialty Provider:: Meredeth Ide Do you need transportation to your follow-up appointment?: No Do you understand care options if your condition(s) worsen?: Yes-patient verbalized understanding    SIGNATURE Karena Addison, LPN [facility] Nurse Health Advisor Direct Dial [phone]

## 2022-12-30 ENCOUNTER — Other Ambulatory Visit: Payer: Self-pay

## 2022-12-30 ENCOUNTER — Ambulatory Visit: Payer: Self-pay | Attending: Nurse Practitioner | Admitting: Nurse Practitioner

## 2022-12-30 ENCOUNTER — Encounter: Payer: Self-pay | Admitting: Nurse Practitioner

## 2022-12-30 VITALS — BP 135/82 | HR 83 | Ht 65.0 in | Wt 150.8 lb

## 2022-12-30 DIAGNOSIS — Z7689 Persons encountering health services in other specified circumstances: Secondary | ICD-10-CM

## 2022-12-30 DIAGNOSIS — Z7984 Long term (current) use of oral hypoglycemic drugs: Secondary | ICD-10-CM

## 2022-12-30 DIAGNOSIS — I10 Essential (primary) hypertension: Secondary | ICD-10-CM

## 2022-12-30 DIAGNOSIS — F17200 Nicotine dependence, unspecified, uncomplicated: Secondary | ICD-10-CM

## 2022-12-30 DIAGNOSIS — I639 Cerebral infarction, unspecified: Secondary | ICD-10-CM

## 2022-12-30 DIAGNOSIS — E1165 Type 2 diabetes mellitus with hyperglycemia: Secondary | ICD-10-CM

## 2022-12-30 DIAGNOSIS — Z1231 Encounter for screening mammogram for malignant neoplasm of breast: Secondary | ICD-10-CM

## 2022-12-30 MED ORDER — ATORVASTATIN CALCIUM 80 MG PO TABS
80.0000 mg | ORAL_TABLET | Freq: Every day | ORAL | 3 refills | Status: DC
  Filled 2022-12-30: qty 90, 90d supply, fill #0
  Filled 2023-04-11: qty 90, 90d supply, fill #1
  Filled 2023-07-05: qty 90, 90d supply, fill #2

## 2022-12-30 MED ORDER — CLOPIDOGREL BISULFATE 75 MG PO TABS
75.0000 mg | ORAL_TABLET | Freq: Every day | ORAL | 0 refills | Status: DC
  Filled 2022-12-30 – 2023-01-24 (×2): qty 90, 90d supply, fill #0

## 2022-12-30 MED ORDER — NICOTINE 21 MG/24HR TD PT24
21.0000 mg | MEDICATED_PATCH | Freq: Every day | TRANSDERMAL | 0 refills | Status: AC
Start: 2022-12-30 — End: 2023-02-10
  Filled 2022-12-30: qty 28, 28d supply, fill #0

## 2022-12-30 MED ORDER — OLMESARTAN MEDOXOMIL 20 MG PO TABS
20.0000 mg | ORAL_TABLET | Freq: Every day | ORAL | 0 refills | Status: DC
  Filled 2022-12-30 – 2023-01-24 (×2): qty 90, 90d supply, fill #0

## 2022-12-30 MED ORDER — ASPIRIN 81 MG PO TBEC
81.0000 mg | DELAYED_RELEASE_TABLET | Freq: Every day | ORAL | 3 refills | Status: DC
  Filled 2022-12-30: qty 90, 90d supply, fill #0
  Filled 2023-01-24: qty 120, 120d supply, fill #0
  Filled 2023-07-05: qty 120, 120d supply, fill #1

## 2022-12-30 MED ORDER — METFORMIN HCL 850 MG PO TABS
850.0000 mg | ORAL_TABLET | Freq: Every day | ORAL | 1 refills | Status: DC
  Filled 2022-12-30 – 2023-01-24 (×2): qty 90, 90d supply, fill #0
  Filled 2023-07-05 – 2023-07-06 (×2): qty 90, 90d supply, fill #1

## 2022-12-30 MED ORDER — AMLODIPINE BESYLATE 10 MG PO TABS
10.0000 mg | ORAL_TABLET | Freq: Every day | ORAL | 0 refills | Status: DC
  Filled 2022-12-30 – 2023-01-24 (×2): qty 90, 90d supply, fill #0

## 2022-12-30 MED ORDER — ATORVASTATIN CALCIUM 80 MG PO TABS
80.0000 mg | ORAL_TABLET | Freq: Every day | ORAL | 3 refills | Status: DC
Start: 2022-12-30 — End: 2022-12-30

## 2022-12-30 NOTE — Progress Notes (Signed)
Assessment & Plan:  Mylo was seen today for hospitalization follow-up.  Diagnoses and all orders for this visit:  Left pontine stroke (HCC) Continues with right sided paresthesias Started on high intensity statin Explained the process of CVA and residual side effects. -     clopidogrel (PLAVIX) 75 MG tablet; Take 1 tablet (75 mg total) by mouth daily. Blood thinner. For stroke -     atorvastatin (LIPITOR) 80 MG tablet; Take 1 tablet (80 mg total) by mouth daily. For cholesterol -     aspirin EC 81 MG tablet; Take 1 tablet (81 mg total) by mouth daily. Swallow whole.  Encounter to establish care Patient has been advised to apply for financial assistance and schedule to see our financial counselor.    Type 2 diabetes mellitus with hyperglycemia, without long-term current use of insulin Well controlled  LDL not at goal -     metFORMIN (GLUCOPHAGE) 850 MG tablet; Take 1 tablet (850 mg total) by mouth daily with breakfast. For diabetes  Tobacco dependence -     CT CHEST LUNG CA SCREEN LOW DOSE W/O CM; Future -     nicotine (NICODERM CQ - DOSED IN MG/24 HOURS) 21 mg/24hr patch; Place 1 patch (21 mg total) onto the skin daily.  Breast cancer screening by mammogram -     MS 3D SCR MAMMO BILAT BR (aka MM); Future  Primary hypertension -     olmesartan (BENICAR) 20 MG tablet; Take 1 tablet (20 mg total) by mouth daily. In morning. For blood pressure -     amLODipine (NORVASC) 10 MG tablet; Take 1 tablet (10 mg total) by mouth daily. FOR Blood pressure    Patient has been counseled on age-appropriate routine health concerns for screening and prevention. These are reviewed and up-to-date. Referrals have been placed accordingly. Immunizations are up-to-date or declined.    Subjective:   Chief Complaint  Patient presents with   Hospitalization Follow-up    Numbness in fingertips   HPI Madeline Blankenship 57 y.o. female presents to office today to establish care.   PMH: HTN,  DM2, HPL, obesity, Tobacco dependence,   Patient has been counseled on age-appropriate routine health concerns for screening and prevention. These are reviewed and up-to-date. Referrals have been placed accordingly. Immunizations are up-to-date or declined.      Madeline Blankenship was recently hospitalized on 12-27-2022 with acute ischemic CVA  She initially presented with right sided paresthesias and slurred speech. MRI confirmed CVA. TTE with LVEF 55-60%. She did not require any HH for PT/OT. Will continue DAPT for 3 months followed by ASA alone. Atorvastatin was increased to 80 mg however this was never prescribed and sent to pharmacy. I will send today. She is requesting nicotine patches to help stop smoking. Did not do well with chantix or wellbutrin in the past.   HTN Blood pressure is well controlled with olmesartan and amlodipine.  BP Readings from Last 3 Encounters:  12/30/22 135/82  12/28/22 (!) 155/73  12/27/22 (!) 154/90    Although DM type 2 is listed on her profile I am unable to locate an A1c less higher than 6.4 in her chart.  Lab Results  Component Value Date   HGBA1C 5.5 12/28/2022    ROS  Past Medical History:  Diagnosis Date   Diabetes mellitus without complication (HCC)    Hyperlipidemia    Hypertension     Past Surgical History:  Procedure Laterality Date   ABDOMINAL HYSTERECTOMY  BREAST EXCISIONAL BIOPSY Right    partial historectomy      Family History  Problem Relation Age of Onset   Diabetes Mother    Hypertension Mother    Cancer Mother    Diabetes Father    Hypertension Father    Cancer Father    Stroke Father    Vision loss Paternal Aunt    Breast cancer Paternal Aunt    Breast cancer Paternal Aunt     Social History Reviewed with no changes to be made today.   Outpatient Medications Prior to Visit  Medication Sig Dispense Refill   Accu-Chek Softclix Lancets lancets USE UP TO 4 TIMES DAILY AS DIRECTED 100 each 0   glucose blood (ACCU-CHEK  GUIDE) test strip Use as instructed 100 each 0   ibuprofen (ADVIL) 800 MG tablet Take 800 mg by mouth.     amLODipine (NORVASC) 10 MG tablet Take 1 tablet (10 mg total) by mouth daily. 90 tablet 0   aspirin EC 81 MG tablet Take 1 tablet (81 mg total) by mouth daily. Swallow whole. 90 tablet 0   clopidogrel (PLAVIX) 75 MG tablet Take 1 tablet (75 mg total) by mouth daily. 90 tablet 0   metFORMIN (GLUCOPHAGE) 850 MG tablet Take 1 tablet (850 mg total) by mouth daily with breakfast. 90 tablet 0   olmesartan (BENICAR) 20 MG tablet Take 1 tablet (20 mg total) by mouth daily. In morning 90 tablet 0   No facility-administered medications prior to visit.    Allergies  Allergen Reactions   Codeine Itching   Doxycycline        Objective:    BP 135/82   Pulse 83   Ht 5\' 5"  (1.651 m)   Wt 150 lb 12.8 oz (68.4 kg)   SpO2 99%   BMI 25.09 kg/m  Wt Readings from Last 3 Encounters:  12/30/22 150 lb 12.8 oz (68.4 kg)  12/27/22 165 lb (74.8 kg)  03/15/22 161 lb 9.6 oz (73.3 kg)    Physical Exam       Patient has been counseled extensively about nutrition and exercise as well as the importance of adherence with medications and regular follow-up. The patient was given clear instructions to go to ER or return to medical center if symptoms don't improve, worsen or new problems develop. The patient verbalized understanding.   Follow-up: Return in about 10 weeks (around 03/10/2023) for NEEDS FINANCIAL ASSISTANCE PAPERWORK.   Claiborne Rigg, FNP-BC [facility] [place], [place] [phone]   12/30/2022, 12:50 PM

## 2023-01-01 NOTE — Telephone Encounter (Signed)
FYI - she is no longer a patient at [facility] - looks like she is being followed by Bertram Denver NP.

## 2023-01-09 ENCOUNTER — Other Ambulatory Visit: Payer: Self-pay

## 2023-01-24 ENCOUNTER — Other Ambulatory Visit: Payer: Self-pay

## 2023-01-25 ENCOUNTER — Other Ambulatory Visit: Payer: Self-pay

## 2023-01-26 ENCOUNTER — Other Ambulatory Visit: Payer: Self-pay

## 2023-03-10 ENCOUNTER — Ambulatory Visit: Payer: 59 | Attending: Nurse Practitioner | Admitting: Nurse Practitioner

## 2023-03-10 ENCOUNTER — Other Ambulatory Visit: Payer: Self-pay

## 2023-03-10 ENCOUNTER — Encounter: Payer: Self-pay | Admitting: Nurse Practitioner

## 2023-03-10 VITALS — BP 134/84 | HR 89 | Ht 65.0 in | Wt 155.6 lb

## 2023-03-10 DIAGNOSIS — J069 Acute upper respiratory infection, unspecified: Secondary | ICD-10-CM | POA: Diagnosis not present

## 2023-03-10 DIAGNOSIS — Z122 Encounter for screening for malignant neoplasm of respiratory organs: Secondary | ICD-10-CM | POA: Diagnosis not present

## 2023-03-10 DIAGNOSIS — I10 Essential (primary) hypertension: Secondary | ICD-10-CM

## 2023-03-10 MED ORDER — PROMETHAZINE-DM 6.25-15 MG/5ML PO SYRP
5.0000 mL | ORAL_SOLUTION | Freq: Four times a day (QID) | ORAL | 0 refills | Status: DC | PRN
Start: 2023-03-10 — End: 2023-06-12
  Filled 2023-03-10: qty 240, 12d supply, fill #0

## 2023-03-10 MED ORDER — ALBUTEROL SULFATE HFA 108 (90 BASE) MCG/ACT IN AERS
2.0000 | INHALATION_SPRAY | Freq: Four times a day (QID) | RESPIRATORY_TRACT | 2 refills | Status: AC | PRN
Start: 2023-03-10 — End: ?
  Filled 2023-03-10: qty 6.7, 25d supply, fill #0

## 2023-03-10 NOTE — Progress Notes (Signed)
Assessment & Plan:  Madeline Blankenship was seen today for medical management of chronic issues.  Diagnoses and all orders for this visit:  Primary hypertension -     amLODipine (NORVASC) 10 MG tablet; Take 1 tablet (10 mg total) by mouth daily. FOR Blood pressure -     olmesartan (BENICAR) 20 MG tablet; Take 1 tablet (20 mg total) by mouth daily. In morning. For blood  Continue all antihypertensives as prescribed.  Reminded to bring in blood pressure log for follow  up appointment.  RECOMMENDATIONS: DASH/Mediterranean Diets are healthier choices for HTN.     Screening for lung cancer -     CT CHEST LUNG CA SCREEN LOW DOSE W/O CM; Future  URI with cough and congestion -     albuterol (VENTOLIN HFA) 108 (90 Base) MCG/ACT inhaler; Inhale 2 puffs into the lungs every 6 (six) hours as needed for wheezing or shortness of breath. -     promethazine-dextromethorphan (PROMETHAZINE-DM) 6.25-15 MG/5ML syrup; Take 5 mLs by mouth 4 (four) times daily as needed for cough. INSTRUCTIONS: use a humidifier for nasal congestion Drink plenty of fluids, rest and wash hands frequently to avoid the spread of infection Alternate tylenol and Motrin for relief of fever     Patient has been counseled on age-appropriate routine health concerns for screening and prevention. These are reviewed and up-to-date. Referrals have been placed accordingly. Immunizations are up-to-date or declined.    Subjective:   Chief Complaint  Patient presents with   Medical Management of Chronic Issues    Madeline Blankenship 57 y.o. female presents to office today for follow-up to hypertension.  Patient has been counseled on age-appropriate routine health concerns for screening and prevention. These are reviewed and up-to-date. Referrals have been placed accordingly. Immunizations are up-to-date or declined.       Currently endorsing symptoms of URI which started 5 days ago.  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  Symptoms include cough and congestion. MAMMOGRAM: OVERDUE. Referral placed Colon Cancer screening: UTD PAP SMEAR: OVERDUE.   HTN Blood pressure is well-controlled.  She is currently taking olmesartan 20 mg daily and amlodipine 10 mg daily as prescribed. BP Readings from Last 3 Encounters:  03/10/23 134/84  12/30/22 135/82  12/28/22 (!) 155/73    Notes a sensation of heaviness in right hip. Onset of symptoms a few weeks ago and unrelated to any specific injury or trauma.   Review of Systems  Constitutional:  Negative for fever, malaise/fatigue and weight loss.  HENT:  Positive for congestion. Negative for nosebleeds.   Eyes: Negative.  Negative for blurred vision, double vision and photophobia.  Respiratory:  Positive for cough. Negative for shortness of breath.   Cardiovascular: Negative.  Negative for chest pain, palpitations and leg swelling.  Gastrointestinal: Negative.  Negative for heartburn, nausea and vomiting.  Musculoskeletal: Negative.  Negative for myalgias.  Neurological: Negative.  Negative for dizziness, focal weakness, seizures and headaches.  Psychiatric/Behavioral: Negative.  Negative for suicidal ideas.     Past Medical History:  Diagnosis Date   Diabetes mellitus without complication (HCC)    Hyperlipidemia    Hypertension     Past Surgical History:  Procedure Laterality Date   ABDOMINAL HYSTERECTOMY     BREAST EXCISIONAL BIOPSY Right    partial historectomy      Family History  Problem Relation Age of Onset   Diabetes Mother  Hypertension Mother    Cancer Mother    Diabetes Father    Hypertension Father    Cancer Father    Stroke Father    Vision loss Paternal Aunt    Breast cancer Paternal Aunt    Breast cancer Paternal Aunt     Social History Reviewed with no changes to be made today.   Outpatient Medications Prior to Visit  Medication Sig Dispense Refill   Accu-Chek  Softclix Lancets lancets USE UP TO 4 TIMES DAILY AS DIRECTED 100 each 0   aspirin EC 81 MG tablet Take 1 tablet (81 mg total) by mouth daily. Swallow whole. 120 tablet 3   atorvastatin (LIPITOR) 80 MG tablet Take 1 tablet (80 mg total) by mouth daily. For cholesterol 90 tablet 3   clopidogrel (PLAVIX) 75 MG tablet Take 1 tablet (75 mg total) by mouth daily. Blood thinner. For stroke 90 tablet 0   glucose blood (ACCU-CHEK GUIDE) test strip Use as instructed 100 each 0   metFORMIN (GLUCOPHAGE) 850 MG tablet Take 1 tablet (850 mg total) by mouth daily with breakfast. For diabetes 90 tablet 1   amLODipine (NORVASC) 10 MG tablet Take 1 tablet (10 mg total) by mouth daily. FOR Blood pressure 90 tablet 0   olmesartan (BENICAR) 20 MG tablet Take 1 tablet (20 mg total) by mouth daily. In morning. For blood pressure 90 tablet 0   ibuprofen (ADVIL) 800 MG tablet Take 800 mg by mouth. (Patient not taking: Reported on 03/10/2023)     No facility-administered medications prior to visit.    Allergies  Allergen Reactions   Codeine Itching   Doxycycline        Objective:    BP 134/84 (BP Location: Left Arm, Patient Position: Sitting, Cuff Size: Normal)   Pulse 89   Ht 5\' 5"  (1.651 m)   Wt 155 lb 9.6 oz (70.6 kg)   SpO2 98%   BMI 25.89 kg/m  Wt Readings from Last 3 Encounters:  03/10/23 155 lb 9.6 oz (70.6 kg)  12/30/22 150 lb 12.8 oz (68.4 kg)  12/27/22 165 lb (74.8 kg)    Physical Exam Vitals and nursing note reviewed.  Constitutional:      Appearance: She is well-developed.  HENT:     Head: Normocephalic and atraumatic.  Cardiovascular:     Rate and Rhythm: Normal rate and regular rhythm.     Heart sounds: Normal heart sounds. No murmur heard.    No friction rub. No gallop.  Pulmonary:     Effort: Pulmonary effort is normal. No tachypnea or respiratory distress.     Breath sounds: Normal breath sounds. No decreased breath sounds, wheezing, rhonchi or rales.  Chest:     Chest wall: No  tenderness.  Abdominal:     General: Bowel sounds are normal.     Palpations: Abdomen is soft.  Musculoskeletal:        General: Normal range of motion.     Cervical back: Normal range of motion.  Skin:    General: Skin is warm and dry.  Neurological:     Mental Status: She is alert and oriented to person, place, and time.     Coordination: Coordination normal.  Psychiatric:        Behavior: Behavior normal. Behavior is cooperative.        Thought Content: Thought content normal.        Judgment: Judgment normal.          Patient has been  counseled extensively about nutrition and exercise as well as the importance of adherence with medications and regular follow-up. The patient was given clear instructions to go to ER or return to medical center if symptoms don't improve, worsen or new problems develop. The patient verbalized understanding.   Follow-up: Return in about 3 months (around 06/08/2023).   Claiborne Rigg, FNP-BC [facility] [place], [place] [phone]   03/19/2023, 10:32 PM

## 2023-03-10 NOTE — Patient Instructions (Signed)
[facility] ?Located in: [facility] ?Address: [address], [place], [place] [address] ?Phone: [phone] ?

## 2023-03-16 ENCOUNTER — Other Ambulatory Visit: Payer: Self-pay

## 2023-03-16 ENCOUNTER — Encounter: Payer: Self-pay | Admitting: Nurse Practitioner

## 2023-03-16 MED ORDER — OLMESARTAN MEDOXOMIL 20 MG PO TABS
20.0000 mg | ORAL_TABLET | Freq: Every day | ORAL | 1 refills | Status: DC
  Filled 2023-03-16 – 2023-04-11 (×2): qty 90, 90d supply, fill #0

## 2023-03-16 MED ORDER — AMLODIPINE BESYLATE 10 MG PO TABS
10.0000 mg | ORAL_TABLET | Freq: Every day | ORAL | 1 refills | Status: DC
  Filled 2023-03-16: qty 90, 90d supply, fill #0

## 2023-03-19 ENCOUNTER — Encounter: Payer: Self-pay | Admitting: Nurse Practitioner

## 2023-03-21 ENCOUNTER — Other Ambulatory Visit: Payer: Self-pay

## 2023-03-23 ENCOUNTER — Ambulatory Visit
Admission: RE | Admit: 2023-03-23 | Discharge: 2023-03-23 | Disposition: A | Discharge status: Home / Self Care | Payer: 59 | Source: Ambulatory Visit | Attending: Nurse Practitioner | Admitting: Nurse Practitioner

## 2023-03-23 DIAGNOSIS — Z122 Encounter for screening for malignant neoplasm of respiratory organs: Secondary | ICD-10-CM

## 2023-04-11 ENCOUNTER — Other Ambulatory Visit: Payer: Self-pay

## 2023-04-11 ENCOUNTER — Other Ambulatory Visit: Payer: Self-pay | Admitting: Nurse Practitioner

## 2023-04-11 DIAGNOSIS — I639 Cerebral infarction, unspecified: Secondary | ICD-10-CM

## 2023-04-18 ENCOUNTER — Other Ambulatory Visit: Payer: Self-pay

## 2023-06-12 ENCOUNTER — Encounter: Payer: Self-pay | Admitting: Nurse Practitioner

## 2023-06-12 ENCOUNTER — Ambulatory Visit: Payer: 59 | Attending: Nurse Practitioner | Admitting: Nurse Practitioner

## 2023-06-12 ENCOUNTER — Other Ambulatory Visit: Payer: Self-pay

## 2023-06-12 VITALS — BP 136/78 | HR 96 | Resp 19 | Ht 65.0 in | Wt 155.0 lb

## 2023-06-12 DIAGNOSIS — Z7984 Long term (current) use of oral hypoglycemic drugs: Secondary | ICD-10-CM | POA: Diagnosis not present

## 2023-06-12 DIAGNOSIS — R7989 Other specified abnormal findings of blood chemistry: Secondary | ICD-10-CM

## 2023-06-12 DIAGNOSIS — Z1231 Encounter for screening mammogram for malignant neoplasm of breast: Secondary | ICD-10-CM

## 2023-06-12 DIAGNOSIS — E119 Type 2 diabetes mellitus without complications: Secondary | ICD-10-CM | POA: Diagnosis not present

## 2023-06-12 DIAGNOSIS — E7800 Pure hypercholesterolemia, unspecified: Secondary | ICD-10-CM

## 2023-06-12 DIAGNOSIS — I639 Cerebral infarction, unspecified: Secondary | ICD-10-CM

## 2023-06-12 DIAGNOSIS — I10 Essential (primary) hypertension: Secondary | ICD-10-CM | POA: Diagnosis not present

## 2023-06-12 DIAGNOSIS — Z23 Encounter for immunization: Secondary | ICD-10-CM | POA: Diagnosis not present

## 2023-06-12 MED ORDER — AMLODIPINE BESYLATE 10 MG PO TABS
10.0000 mg | ORAL_TABLET | Freq: Every day | ORAL | 1 refills | Status: DC
Start: 2023-06-12 — End: 2023-09-12
  Filled 2023-06-12: qty 30, 30d supply, fill #0

## 2023-06-12 NOTE — Progress Notes (Signed)
 4 days. Red to black spots on skin

## 2023-06-12 NOTE — Progress Notes (Signed)
 Assessment & Plan:  Madeline Blankenship was seen today for medical management of chronic issues.  Diagnoses and all orders for this visit:  Primary hypertension -     amLODipine (NORVASC) 10 MG tablet; Take 1 tablet (10 mg total) by mouth daily. FOR Blood pressure -     CMP14+EGFR  Diabetes mellitus treated with oral medication (HCC) -     Urine Albumin/Creatinine with ratio (send out) [LAB689] -     Ambulatory referral to Ophthalmology -     CMP14+EGFR  Left pontine stroke [facility]) -     Ambulatory referral to Ophthalmology -     Lipid panel  Hypercholesterolemia -     Lipid panel  Low serum calcium -     VITAMIN D 25 Hydroxy (Vit-D Deficiency, Fractures) -     CMP14+EGFR  Breast cancer screening by mammogram -     MM 3D SCREENING MAMMOGRAM BILATERAL BREAST; Future  Influenza vaccine administered -     Flu vaccine trivalent PF, 6mos and older(Flulaval,Afluria,Fluarix,Fluzone)  Need for vaccination with 20-polyvalent pneumococcal conjugate vaccine -     Pneumococcal conjugate vaccine 20-valent    Patient has been counseled on age-appropriate routine health concerns for screening and prevention. These are reviewed and up-to-date. Referrals have been placed accordingly. Immunizations are up-to-date or declined.    Subjective:   Chief Complaint  Patient presents with   Medical Management of Chronic Issues    Madeline Blankenship 58 y.o. female presents to office today for follow up to HTN.   She has PMH: HPL, DM, HTN, left pontine stroke, tobacco dependence.  Blood pressure is well-controlled today.  Currently taking olmesartan and amlodipine daily as prescribed. 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  States that left knee was initially swollen and very painful and currently the swelling has resolved however pain is still present  with extension of left leg.     Woke up one morning and everything was grey in regard to her vision. Occurred last month.  Visual disturbance lasted about 30 minutes and then resolved after she vigorously rubbed her eyes for several minutes.  If she tries to look back in her peripheral vision she does get dizzy.  Also seeing white spots as floaters.  She has not seen an ophthalmologist in quite some time.     A1c for diabetes at goal.  Currently taking metformin 850 mg daily as prescribed. Lab Results  Component Value Date   HGBA1C 5.5 12/28/2022  Currently taking high intensity statin. Lab Results  Component Value Date   LDLCALC 152 (H) 12/28/2022       Review of Systems  Constitutional:  Negative for fever, malaise/fatigue and weight loss.  HENT: Negative.  Negative for nosebleeds.   Eyes: Negative.  Negative for blurred vision, double vision and photophobia.  Respiratory: Negative.  Negative for cough and shortness of breath.   Cardiovascular: Negative.  Negative for chest pain, palpitations and leg swelling.  Gastrointestinal: Negative.  Negative for heartburn, nausea and vomiting.  Musculoskeletal: Negative.  Negative for myalgias.  Neurological: Negative.  Negative for dizziness, focal weakness, seizures and headaches.  Psychiatric/Behavioral: Negative.  Negative for suicidal ideas.     Past Medical History:  Diagnosis Date   Diabetes mellitus without complication (HCC)    Hyperlipidemia    Hypertension  Past Surgical History:  Procedure Laterality Date   ABDOMINAL HYSTERECTOMY     BREAST EXCISIONAL BIOPSY Right    partial historectomy      Family History  Problem Relation Age of Onset   Diabetes Mother    Hypertension Mother    Cancer Mother    Diabetes Father    Hypertension Father    Cancer Father    Stroke Father    Vision loss Paternal Aunt    Breast cancer Paternal Aunt    Breast cancer Paternal Aunt     Social History Reviewed with no changes  to be made today.   Outpatient Medications Prior to Visit  Medication Sig Dispense Refill   Accu-Chek Softclix Lancets lancets USE UP TO 4 TIMES DAILY AS DIRECTED 100 each 0   albuterol (VENTOLIN HFA) 108 (90 Base) MCG/ACT inhaler Inhale 2 puffs into the lungs every 6 (six) hours as needed for wheezing or shortness of breath. 6.7 g 2   aspirin EC 81 MG tablet Take 1 tablet (81 mg total) by mouth daily. Swallow whole. 120 tablet 3   atorvastatin (LIPITOR) 80 MG tablet Take 1 tablet (80 mg total) by mouth daily. For cholesterol 90 tablet 3   glucose blood (ACCU-CHEK GUIDE) test strip Use as instructed 100 each 0   metFORMIN (GLUCOPHAGE) 850 MG tablet Take 1 tablet (850 mg total) by mouth daily with breakfast. For diabetes 90 tablet 1   olmesartan (BENICAR) 20 MG tablet Take 1 tablet (20 mg total) by mouth daily. In morning. For blood pressure 90 tablet 1   amLODipine (NORVASC) 10 MG tablet Take 1 tablet (10 mg total) by mouth daily. FOR Blood pressure 90 tablet 1   clopidogrel (PLAVIX) 75 MG tablet Take 1 tablet (75 mg total) by mouth daily. Blood thinner. For stroke 90 tablet 0   promethazine-dextromethorphan (PROMETHAZINE-DM) 6.25-15 MG/5ML syrup Take 5 mLs by mouth 4 (four) times daily as needed for cough. 240 mL 0   No facility-administered medications prior to visit.    Allergies  Allergen Reactions   Codeine Itching   Doxycycline        Objective:    BP 136/78 (BP Location: Left Arm, Patient Position: Sitting, Cuff Size: Normal)   Pulse 96   Resp 19   Ht 5\' 5"  (1.651 m)   Wt 155 lb (70.3 kg)   SpO2 100%   BMI 25.79 kg/m  Wt Readings from Last 3 Encounters:  06/12/23 155 lb (70.3 kg)  03/10/23 155 lb 9.6 oz (70.6 kg)  12/30/22 150 lb 12.8 oz (68.4 kg)    Physical Exam Vitals and nursing note reviewed.  Constitutional:      Appearance: She is well-developed.  HENT:     Head: Normocephalic and atraumatic.  Cardiovascular:     Rate and Rhythm: Normal rate and regular  rhythm.     Heart sounds: Normal heart sounds. No murmur heard.    No friction rub. No gallop.  Pulmonary:     Effort: Pulmonary effort is normal. No tachypnea or respiratory distress.     Breath sounds: Normal breath sounds. No decreased breath sounds, wheezing, rhonchi or rales.  Chest:     Chest wall: No tenderness.  Abdominal:     General: Bowel sounds are normal.     Palpations: Abdomen is soft.  Musculoskeletal:        General: Normal range of motion.     Cervical back: Normal range of motion.  Skin:  General: Skin is warm and dry.  Neurological:     Mental Status: She is alert and oriented to person, place, and time.     Coordination: Coordination normal.  Psychiatric:        Behavior: Behavior normal. Behavior is cooperative.        Thought Content: Thought content normal.        Judgment: Judgment normal.          Patient has been counseled extensively about nutrition and exercise as well as the importance of adherence with medications and regular follow-up. The patient was given clear instructions to go to ER or return to medical center if symptoms don't improve, worsen or new problems develop. The patient verbalized understanding.   Follow-up: Return in about 3 months (around 09/12/2023).   Claiborne Rigg, FNP-BC [facility] [place], [place] [phone]   06/12/2023, 1:06 PM

## 2023-06-14 LAB — CMP14+EGFR
ALT: 11 IU/L (ref 0–32)
AST: 12 IU/L (ref 0–40)
Albumin: 4.4 g/dL (ref 3.8–4.9)
Alkaline Phosphatase: 93 IU/L (ref 44–121)
BUN/Creatinine Ratio: 11 (ref 9–23)
BUN: 10 mg/dL (ref 6–24)
Bilirubin Total: 0.2 mg/dL (ref 0.0–1.2)
CO2: 22 mmol/L (ref 20–29)
Calcium: 9.6 mg/dL (ref 8.7–10.2)
Chloride: 106 mmol/L (ref 96–106)
Creatinine, Ser: 0.93 mg/dL (ref 0.57–1.00)
Globulin, Total: 2.5 g/dL (ref 1.5–4.5)
Glucose: 83 mg/dL (ref 70–99)
Potassium: 4.7 mmol/L (ref 3.5–5.2)
Sodium: 143 mmol/L (ref 134–144)
Total Protein: 6.9 g/dL (ref 6.0–8.5)
eGFR: 73 mL/min/{1.73_m2} (ref >59)

## 2023-06-14 LAB — MICROALBUMIN / CREATININE URINE RATIO
Creatinine, Urine: 73 mg/dL
Microalb/Creat Ratio: 8 mg/g{creat} (ref 0–29)
Microalbumin, Urine: 6.1 ug/mL

## 2023-06-14 LAB — VITAMIN D 25 HYDROXY (VIT D DEFICIENCY, FRACTURES): Vit D, 25-Hydroxy: 19.4 ng/mL — ABNORMAL LOW (ref 30.0–100.0)

## 2023-06-14 LAB — LIPID PANEL
Chol/HDL Ratio: 3.1 ratio (ref 0.0–4.4)
Cholesterol, Total: 132 mg/dL (ref 100–199)
HDL: 42 mg/dL (ref >39)
LDL Chol Calc (NIH): 74 mg/dL (ref 0–99)
Triglycerides: 80 mg/dL (ref 0–149)
VLDL Cholesterol Cal: 16 mg/dL (ref 5–40)

## 2023-06-18 ENCOUNTER — Encounter: Payer: Self-pay | Admitting: Nurse Practitioner

## 2023-06-18 ENCOUNTER — Other Ambulatory Visit: Payer: Self-pay | Admitting: Nurse Practitioner

## 2023-06-18 DIAGNOSIS — E559 Vitamin D deficiency, unspecified: Secondary | ICD-10-CM

## 2023-06-18 MED ORDER — VITAMIN D (ERGOCALCIFEROL) 1.25 MG (50000 UNIT) PO CAPS
50000.0000 [IU] | ORAL_CAPSULE | ORAL | 0 refills | Status: DC
  Filled 2023-06-18: qty 4, 28d supply, fill #0
  Filled 2023-07-05: qty 4, 28d supply, fill #1

## 2023-06-19 ENCOUNTER — Other Ambulatory Visit: Payer: Self-pay

## 2023-06-20 ENCOUNTER — Other Ambulatory Visit: Payer: Self-pay

## 2023-07-06 ENCOUNTER — Other Ambulatory Visit: Payer: Self-pay

## 2023-07-17 ENCOUNTER — Other Ambulatory Visit: Payer: Self-pay

## 2023-09-12 ENCOUNTER — Other Ambulatory Visit: Payer: Self-pay

## 2023-09-12 ENCOUNTER — Ambulatory Visit: Payer: Self-pay | Attending: Nurse Practitioner | Admitting: Nurse Practitioner

## 2023-09-12 ENCOUNTER — Encounter: Payer: Self-pay | Admitting: Nurse Practitioner

## 2023-09-12 VITALS — BP 170/80 | HR 91 | Resp 19 | Ht 65.0 in | Wt 158.8 lb

## 2023-09-12 DIAGNOSIS — E1165 Type 2 diabetes mellitus with hyperglycemia: Secondary | ICD-10-CM

## 2023-09-12 DIAGNOSIS — I639 Cerebral infarction, unspecified: Secondary | ICD-10-CM

## 2023-09-12 DIAGNOSIS — I10 Essential (primary) hypertension: Secondary | ICD-10-CM

## 2023-09-12 DIAGNOSIS — Z7984 Long term (current) use of oral hypoglycemic drugs: Secondary | ICD-10-CM

## 2023-09-12 DIAGNOSIS — I6930 Unspecified sequelae of cerebral infarction: Secondary | ICD-10-CM

## 2023-09-12 MED ORDER — ATORVASTATIN CALCIUM 80 MG PO TABS
80.0000 mg | ORAL_TABLET | Freq: Every day | ORAL | 3 refills | Status: AC
  Filled 2023-09-12 – 2023-10-10 (×2): qty 90, 90d supply, fill #0
  Filled 2024-01-08: qty 30, 30d supply, fill #0

## 2023-09-12 MED ORDER — METFORMIN HCL 850 MG PO TABS
850.0000 mg | ORAL_TABLET | Freq: Every day | ORAL | 1 refills | Status: AC
  Filled 2023-09-12 – 2024-01-08 (×2): qty 90, 90d supply, fill #0

## 2023-09-12 MED ORDER — OLMESARTAN MEDOXOMIL 40 MG PO TABS
40.0000 mg | ORAL_TABLET | Freq: Every day | ORAL | 1 refills | Status: AC
  Filled 2023-09-12: qty 90, 90d supply, fill #0
  Filled 2024-01-08: qty 90, 90d supply, fill #1

## 2023-09-12 MED ORDER — ASPIRIN 81 MG PO TBEC
81.0000 mg | DELAYED_RELEASE_TABLET | Freq: Every day | ORAL | 3 refills | Status: AC
  Filled 2023-09-12 – 2024-01-08 (×2): qty 120, 120d supply, fill #0

## 2023-09-12 MED ORDER — AMLODIPINE BESYLATE 10 MG PO TABS
10.0000 mg | ORAL_TABLET | Freq: Every day | ORAL | 1 refills | Status: AC
  Filled 2023-09-12: qty 90, 90d supply, fill #0
  Filled 2024-01-08: qty 90, 90d supply, fill #1

## 2023-09-12 NOTE — Progress Notes (Signed)
 Assessment & Plan:   Madeline Blankenship was seen today for hypertension.  Diagnoses and all orders for this visit:  Primary hypertension Start amlodipine  -     amLODipine  (NORVASC ) 10 MG tablet; Take 1 tablet (10 mg total) by mouth daily. FOR Blood pressure -     olmesartan  (BENICAR ) 40 MG tablet; Take 1 tablet (40 mg total) by mouth daily. In morning. For blood pressure  Type 2 diabetes mellitus with hyperglycemia, without long-term current use of insulin (HCC) -     metFORMIN  (GLUCOPHAGE ) 850 MG tablet; Take 1 tablet (850 mg total) by mouth daily with breakfast. For diabetes -     Hemoglobin A1c  Left pontine stroke (HCC) -     aspirin  EC 81 MG tablet; Take 1 tablet (81 mg total) by mouth daily. Swallow whole. -     atorvastatin  (LIPITOR) 80 MG tablet; Take 1 tablet (80 mg total) by mouth daily. For cholesterol    Patient has been counseled on age-appropriate routine health concerns for screening and prevention. These are reviewed and up-to-date. Referrals have been placed accordingly. Immunizations are up-to-date or declined.    Subjective:   Chief Complaint  Patient presents with   Hypertension    Madeline Blankenship 58 y.o. female presents to office today for  HTN  She has PMH: HPL, DM, HTN, left pontine stroke, tobacco dependence.   Patient has been counseled on age-appropriate routine health concerns for screening and prevention. These are reviewed and up-to-date. Referrals have been placed accordingly. Immunizations are up-to-date or declined.     MAMMOGRAM: Overdue. Referral placed.  PAP SMEAR: N/A hysterectomy COLON CANCER SCREENING: UTD   HTN Blood pressure is significantly elevated today. Based on her profile amlodipine  has not been dispensed. She is taking benicar  40 mg instead of 20 mg  BP Readings from Last 3 Encounters:  09/12/23 (!) 170/80  06/12/23 136/78  03/10/23 134/84    Prediabetes Controlled with diet at this time.  Lab Results  Component Value Date    HGBA1C 5.5 12/28/2022   LDl at goal with high intensity statin  Lab Results  Component Value Date   LDLCALC 74 06/12/2023     Review of Systems  Constitutional:  Negative for fever, malaise/fatigue and weight loss.  HENT: Negative.  Negative for nosebleeds.   Eyes: Negative.  Negative for blurred vision, double vision and photophobia.  Respiratory: Negative.  Negative for cough and shortness of breath.   Cardiovascular: Negative.  Negative for chest pain, palpitations and leg swelling.  Gastrointestinal: Negative.  Negative for heartburn, nausea and vomiting.  Musculoskeletal: Negative.  Negative for myalgias.  Neurological: Negative.  Negative for dizziness, focal weakness, seizures and headaches.  Psychiatric/Behavioral: Negative.  Negative for suicidal ideas.     Past Medical History:  Diagnosis Date   Diabetes mellitus without complication (HCC)    Hyperlipidemia    Hypertension     Past Surgical History:  Procedure Laterality Date   ABDOMINAL HYSTERECTOMY     BREAST EXCISIONAL BIOPSY Right    partial historectomy      Family History  Problem Relation Age of Onset   Diabetes Mother    Hypertension Mother    Cancer Mother    Diabetes Father    Hypertension Father    Cancer Father    Stroke Father    Vision loss Paternal Aunt    Breast cancer Paternal Aunt    Breast cancer Paternal Aunt     Social History Reviewed with no  changes to be made today.   Outpatient Medications Prior to Visit  Medication Sig Dispense Refill   Accu-Chek Softclix Lancets lancets USE UP TO 4 TIMES DAILY AS DIRECTED 100 each 0   albuterol  (VENTOLIN  HFA) 108 (90 Base) MCG/ACT inhaler Inhale 2 puffs into the lungs every 6 (six) hours as needed for wheezing or shortness of breath. 6.7 g 2   glucose blood (ACCU-CHEK GUIDE) test strip Use as instructed 100 each 0   aspirin  EC 81 MG tablet Take 1 tablet (81 mg total) by mouth daily. Swallow whole. 120 tablet 3   atorvastatin  (LIPITOR) 80  MG tablet Take 1 tablet (80 mg total) by mouth daily. For cholesterol 90 tablet 3   metFORMIN  (GLUCOPHAGE ) 850 MG tablet Take 1 tablet (850 mg total) by mouth daily with breakfast. For diabetes 90 tablet 1   olmesartan  (BENICAR ) 20 MG tablet Take 1 tablet (20 mg total) by mouth daily. In morning. For blood pressure 90 tablet 1   amLODipine  (NORVASC ) 10 MG tablet Take 1 tablet (10 mg total) by mouth daily. FOR Blood pressure (Patient not taking: Reported on 09/12/2023) 90 tablet 1   Vitamin D , Ergocalciferol , (DRISDOL ) 1.25 MG (50000 UNIT) CAPS capsule Take 1 capsule (50,000 Units total) by mouth once a week. (Patient not taking: Reported on 09/12/2023) 12 capsule 0   No facility-administered medications prior to visit.    Allergies  Allergen Reactions   Codeine Itching   Doxycycline        Objective:    BP (!) 170/80 (BP Location: Left Arm, Patient Position: Sitting, Cuff Size: Normal)   Pulse 91   Resp 19   Ht 5\' 5"  (1.651 m)   Wt 158 lb 12.8 oz (72 kg)   SpO2 98%   BMI 26.43 kg/m  Wt Readings from Last 3 Encounters:  09/12/23 158 lb 12.8 oz (72 kg)  06/12/23 155 lb (70.3 kg)  03/10/23 155 lb 9.6 oz (70.6 kg)    Physical Exam Vitals and nursing note reviewed.  Constitutional:      Appearance: She is well-developed.  HENT:     Head: Normocephalic and atraumatic.  Cardiovascular:     Rate and Rhythm: Normal rate and regular rhythm.     Heart sounds: Normal heart sounds. No murmur heard.    No friction rub. No gallop.  Pulmonary:     Effort: Pulmonary effort is normal. No tachypnea or respiratory distress.     Breath sounds: Normal breath sounds. No decreased breath sounds, wheezing, rhonchi or rales.  Chest:     Chest wall: No tenderness.  Abdominal:     General: Bowel sounds are normal.     Palpations: Abdomen is soft.  Musculoskeletal:        General: Normal range of motion.     Cervical back: Normal range of motion.  Skin:    General: Skin is warm and dry.   Neurological:     Mental Status: She is alert and oriented to person, place, and time.     Coordination: Coordination normal.  Psychiatric:        Behavior: Behavior normal. Behavior is cooperative.        Thought Content: Thought content normal.        Judgment: Judgment normal.          Patient has been counseled extensively about nutrition and exercise as well as the importance of adherence with medications and regular follow-up. The patient was given clear instructions to go  to ER or return to medical center if symptoms don't improve, worsen or new problems develop. The patient verbalized understanding.   Follow-up: Return if symptoms worsen or fail to improve.   Collins Dean, FNP-BC [facility] [place], [place] [phone]   09/12/2023, 10:56 PM

## 2023-09-13 ENCOUNTER — Other Ambulatory Visit: Payer: Self-pay

## 2023-09-13 LAB — HEMOGLOBIN A1C
Est. average glucose Bld gHb Est-mCnc: 111 mg/dL
Hgb A1c MFr Bld: 5.5 % (ref 4.8–5.6)

## 2023-09-18 ENCOUNTER — Ambulatory Visit: Payer: Self-pay | Admitting: Nurse Practitioner

## 2023-09-18 ENCOUNTER — Other Ambulatory Visit: Payer: Self-pay

## 2023-09-18 ENCOUNTER — Other Ambulatory Visit: Payer: Self-pay | Admitting: Nurse Practitioner

## 2023-09-18 DIAGNOSIS — E559 Vitamin D deficiency, unspecified: Secondary | ICD-10-CM

## 2023-09-19 ENCOUNTER — Other Ambulatory Visit: Payer: Self-pay

## 2023-10-10 ENCOUNTER — Other Ambulatory Visit: Payer: Self-pay

## 2023-10-19 ENCOUNTER — Other Ambulatory Visit: Payer: Self-pay

## 2024-01-08 ENCOUNTER — Other Ambulatory Visit: Payer: Self-pay

## 2024-01-11 ENCOUNTER — Other Ambulatory Visit: Payer: Self-pay
